# Patient Record
Sex: Female | Born: 1935 | Race: Black or African American | Hispanic: No | Smoking: Never smoker
Health system: Southern US, Community
[De-identification: ages and names within clinical notes are randomized; demographics above are authoritative.]

## PROBLEM LIST (undated history)

## (undated) DIAGNOSIS — S065X9A Traumatic subdural hemorrhage with loss of consciousness of unspecified duration, initial encounter: Secondary | ICD-10-CM

## (undated) DIAGNOSIS — E119 Type 2 diabetes mellitus without complications: Secondary | ICD-10-CM

## (undated) DIAGNOSIS — S0003XA Contusion of scalp, initial encounter: Secondary | ICD-10-CM

## (undated) DIAGNOSIS — D649 Anemia, unspecified: Secondary | ICD-10-CM

## (undated) DIAGNOSIS — J449 Chronic obstructive pulmonary disease, unspecified: Secondary | ICD-10-CM

## (undated) DIAGNOSIS — R7989 Other specified abnormal findings of blood chemistry: Secondary | ICD-10-CM

## (undated) DIAGNOSIS — E039 Hypothyroidism, unspecified: Secondary | ICD-10-CM

## (undated) DIAGNOSIS — J8410 Pulmonary fibrosis, unspecified: Secondary | ICD-10-CM

## (undated) DIAGNOSIS — F0390 Unspecified dementia without behavioral disturbance: Secondary | ICD-10-CM

## (undated) HISTORY — PX: NO PAST SURGERIES: SHX2092

---

## 2001-03-08 ENCOUNTER — Encounter: Payer: Self-pay | Admitting: Emergency Medicine

## 2001-03-08 ENCOUNTER — Emergency Department (HOSPITAL_COMMUNITY): Admission: EM | Admit: 2001-03-08 | Discharge: 2001-03-08 | Payer: Self-pay | Admitting: Emergency Medicine

## 2001-03-11 ENCOUNTER — Emergency Department (HOSPITAL_COMMUNITY): Admission: EM | Admit: 2001-03-11 | Discharge: 2001-03-11 | Payer: Self-pay | Admitting: *Deleted

## 2003-06-16 ENCOUNTER — Emergency Department (HOSPITAL_COMMUNITY): Admission: EM | Admit: 2003-06-16 | Discharge: 2003-06-16 | Payer: Self-pay | Admitting: Emergency Medicine

## 2003-08-18 ENCOUNTER — Ambulatory Visit (HOSPITAL_COMMUNITY): Admission: RE | Admit: 2003-08-18 | Discharge: 2003-08-18 | Payer: Self-pay | Admitting: Oncology

## 2003-12-15 ENCOUNTER — Emergency Department (HOSPITAL_COMMUNITY): Admission: EM | Admit: 2003-12-15 | Discharge: 2003-12-15 | Payer: Self-pay | Admitting: Emergency Medicine

## 2004-06-14 ENCOUNTER — Ambulatory Visit: Payer: Self-pay | Admitting: Oncology

## 2004-09-02 ENCOUNTER — Ambulatory Visit: Payer: Self-pay | Admitting: Oncology

## 2004-10-01 ENCOUNTER — Encounter (HOSPITAL_COMMUNITY): Admission: RE | Admit: 2004-10-01 | Discharge: 2004-12-30 | Payer: Self-pay | Admitting: Oncology

## 2004-10-23 ENCOUNTER — Ambulatory Visit: Payer: Self-pay | Admitting: Oncology

## 2004-12-11 ENCOUNTER — Ambulatory Visit: Payer: Self-pay | Admitting: Oncology

## 2005-02-14 ENCOUNTER — Ambulatory Visit: Payer: Self-pay | Admitting: Oncology

## 2005-04-02 ENCOUNTER — Ambulatory Visit: Payer: Self-pay | Admitting: Oncology

## 2005-06-18 ENCOUNTER — Ambulatory Visit: Payer: Self-pay | Admitting: Oncology

## 2005-09-22 ENCOUNTER — Ambulatory Visit: Payer: Self-pay | Admitting: Oncology

## 2006-01-06 ENCOUNTER — Ambulatory Visit: Payer: Self-pay | Admitting: Oncology

## 2006-02-10 LAB — CBC WITH DIFFERENTIAL/PLATELET
Basophils Absolute: 0.1 10*3/uL (ref 0.0–0.1)
Eosinophils Absolute: 0.3 10*3/uL (ref 0.0–0.5)
HCT: 30 % — ABNORMAL LOW (ref 34.8–46.6)
HGB: 10.7 g/dL — ABNORMAL LOW (ref 11.6–15.9)
MONO#: 0.6 10*3/uL (ref 0.1–0.9)
NEUT%: 55.5 % (ref 39.6–76.8)
WBC: 9.5 10*3/uL (ref 3.9–10.0)
lymph#: 3.3 10*3/uL (ref 0.9–3.3)

## 2006-06-05 ENCOUNTER — Ambulatory Visit: Payer: Self-pay | Admitting: Oncology

## 2006-06-30 LAB — CBC WITH DIFFERENTIAL/PLATELET
BASO%: 0.6 % (ref 0.0–2.0)
HCT: 27.4 % — ABNORMAL LOW (ref 34.8–46.6)
MCHC: 35.7 g/dL (ref 32.0–36.0)
MONO#: 0.7 10*3/uL (ref 0.1–0.9)
NEUT%: 50.2 % (ref 39.6–76.8)
WBC: 9.6 10*3/uL (ref 3.9–10.0)
lymph#: 3.7 10*3/uL — ABNORMAL HIGH (ref 0.9–3.3)

## 2006-10-15 ENCOUNTER — Ambulatory Visit: Payer: Self-pay | Admitting: Oncology

## 2008-12-16 ENCOUNTER — Emergency Department (HOSPITAL_COMMUNITY): Admission: EM | Admit: 2008-12-16 | Discharge: 2008-12-16 | Payer: Self-pay | Admitting: Emergency Medicine

## 2008-12-20 ENCOUNTER — Inpatient Hospital Stay: Admission: RE | Admit: 2008-12-20 | Discharge: 2009-01-09 | Payer: Self-pay | Admitting: Internal Medicine

## 2009-12-04 ENCOUNTER — Emergency Department (HOSPITAL_COMMUNITY): Admission: EM | Admit: 2009-12-04 | Discharge: 2009-12-04 | Payer: Self-pay | Admitting: Emergency Medicine

## 2009-12-06 ENCOUNTER — Emergency Department (HOSPITAL_COMMUNITY): Admission: EM | Admit: 2009-12-06 | Discharge: 2009-12-06 | Payer: Self-pay | Admitting: Emergency Medicine

## 2009-12-31 ENCOUNTER — Emergency Department (HOSPITAL_COMMUNITY): Admission: EM | Admit: 2009-12-31 | Discharge: 2009-12-31 | Payer: Self-pay | Admitting: Emergency Medicine

## 2010-10-22 LAB — URINALYSIS, ROUTINE W REFLEX MICROSCOPIC
Bilirubin Urine: NEGATIVE
Glucose, UA: 250 mg/dL — AB
Hgb urine dipstick: NEGATIVE
Ketones, ur: NEGATIVE mg/dL
Leukocytes, UA: NEGATIVE
Nitrite: NEGATIVE
Specific Gravity, Urine: 1.025 (ref 1.005–1.030)
Urobilinogen, UA: 0.2 mg/dL (ref 0.0–1.0)
pH: 6 (ref 5.0–8.0)

## 2010-10-22 LAB — COMPREHENSIVE METABOLIC PANEL
ALT: 14 U/L (ref 0–35)
AST: 19 U/L (ref 0–37)
Albumin: 3.6 g/dL (ref 3.5–5.2)
Alkaline Phosphatase: 78 U/L (ref 39–117)
BUN: 17 mg/dL (ref 6–23)
CO2: 28 mEq/L (ref 19–32)
Calcium: 9.4 mg/dL (ref 8.4–10.5)
Chloride: 97 mEq/L (ref 96–112)
Creatinine, Ser: 0.95 mg/dL (ref 0.4–1.2)
GFR calc Af Amer: >60 mL/min (ref >60)
GFR calc non Af Amer: 56 mL/min — ABNORMAL LOW (ref >60)
Glucose, Bld: 306 mg/dL — ABNORMAL HIGH (ref 70–99)
Potassium: 4.2 mEq/L (ref 3.5–5.1)
Sodium: 132 mEq/L — ABNORMAL LOW (ref 135–145)
Total Bilirubin: 0.5 mg/dL (ref 0.3–1.2)
Total Protein: 8.1 g/dL (ref 6.0–8.3)

## 2010-10-22 LAB — URINE MICROSCOPIC-ADD ON

## 2010-10-22 LAB — CBC
HCT: 36 % (ref 36.0–46.0)
Hemoglobin: 12.7 g/dL (ref 12.0–15.0)
MCHC: 35.1 g/dL (ref 30.0–36.0)
MCV: 90.2 fL (ref 78.0–100.0)
Platelets: 186 10*3/uL (ref 150–400)
RBC: 3.99 MIL/uL (ref 3.87–5.11)
RDW: 13.7 % (ref 11.5–15.5)
WBC: 7.7 10*3/uL (ref 4.0–10.5)

## 2010-10-22 LAB — URINE CULTURE
Colony Count: NO GROWTH
Culture: NO GROWTH

## 2010-10-22 LAB — DIFFERENTIAL
Basophils Absolute: 0 10*3/uL (ref 0.0–0.1)
Basophils Relative: 1 % (ref 0–1)
Eosinophils Absolute: 0.2 10*3/uL (ref 0.0–0.7)
Eosinophils Relative: 3 % (ref 0–5)
Lymphocytes Relative: 31 % (ref 12–46)
Lymphs Abs: 2.4 10*3/uL (ref 0.7–4.0)
Monocytes Absolute: 0.5 10*3/uL (ref 0.1–1.0)
Monocytes Relative: 7 % (ref 3–12)
Neutro Abs: 4.6 10*3/uL (ref 1.7–7.7)
Neutrophils Relative %: 59 % (ref 43–77)

## 2010-10-22 LAB — GLUCOSE, CAPILLARY: Glucose-Capillary: 306 mg/dL — ABNORMAL HIGH (ref 70–99)

## 2010-11-12 LAB — URINALYSIS, ROUTINE W REFLEX MICROSCOPIC
Bilirubin Urine: NEGATIVE
Glucose, UA: NEGATIVE mg/dL
Hgb urine dipstick: NEGATIVE
Ketones, ur: NEGATIVE mg/dL
Nitrite: NEGATIVE
Protein, ur: NEGATIVE mg/dL
Specific Gravity, Urine: 1.015 (ref 1.005–1.030)
Urobilinogen, UA: 2 mg/dL — ABNORMAL HIGH (ref 0.0–1.0)
pH: 6 (ref 5.0–8.0)

## 2010-11-12 LAB — DIFFERENTIAL
Basophils Absolute: 0.1 10*3/uL (ref 0.0–0.1)
Basophils Relative: 1 % (ref 0–1)
Eosinophils Absolute: 0.6 10*3/uL (ref 0.0–0.7)
Eosinophils Relative: 5 % (ref 0–5)
Lymphocytes Relative: 35 % (ref 12–46)
Lymphs Abs: 4.3 10*3/uL — ABNORMAL HIGH (ref 0.7–4.0)
Monocytes Absolute: 0.9 10*3/uL (ref 0.1–1.0)
Monocytes Relative: 7 % (ref 3–12)
Neutro Abs: 6.4 10*3/uL (ref 1.7–7.7)
Neutrophils Relative %: 52 % (ref 43–77)

## 2010-11-12 LAB — BASIC METABOLIC PANEL
BUN: 16 mg/dL (ref 6–23)
CO2: 32 mEq/L (ref 19–32)
Calcium: 10.1 mg/dL (ref 8.4–10.5)
Chloride: 99 mEq/L (ref 96–112)
Creatinine, Ser: 0.95 mg/dL (ref 0.4–1.2)
GFR calc Af Amer: >60 mL/min (ref >60)
GFR calc non Af Amer: 56 mL/min — ABNORMAL LOW (ref >60)
Glucose, Bld: 154 mg/dL — ABNORMAL HIGH (ref 70–99)
Potassium: 3.8 mEq/L (ref 3.5–5.1)
Sodium: 137 mEq/L (ref 135–145)

## 2010-11-12 LAB — URINE MICROSCOPIC-ADD ON

## 2010-11-12 LAB — CBC
HCT: 30.5 % — ABNORMAL LOW (ref 36.0–46.0)
Hemoglobin: 11.1 g/dL — ABNORMAL LOW (ref 12.0–15.0)
MCHC: 36.4 g/dL — ABNORMAL HIGH (ref 30.0–36.0)
MCV: 95.7 fL (ref 78.0–100.0)
Platelets: 260 10*3/uL (ref 150–400)
RBC: 3.19 MIL/uL — ABNORMAL LOW (ref 3.87–5.11)
RDW: 17 % — ABNORMAL HIGH (ref 11.5–15.5)
WBC: 12.4 10*3/uL — ABNORMAL HIGH (ref 4.0–10.5)

## 2013-01-10 ENCOUNTER — Non-Acute Institutional Stay (SKILLED_NURSING_FACILITY): Payer: Medicare Other | Admitting: Internal Medicine

## 2013-01-10 DIAGNOSIS — R269 Unspecified abnormalities of gait and mobility: Secondary | ICD-10-CM

## 2013-01-10 DIAGNOSIS — J449 Chronic obstructive pulmonary disease, unspecified: Secondary | ICD-10-CM

## 2013-01-10 DIAGNOSIS — F0281 Dementia in other diseases classified elsewhere with behavioral disturbance: Secondary | ICD-10-CM

## 2013-01-19 NOTE — Progress Notes (Addendum)
Patient ID: Dominique Miller, female   DOB: November 15, 1918, 79 y.o.   MRN: [id number]           HISTORY & PHYSICAL  DATE:  01/10/2013  FACILITY: [facility]   LEVEL OF CARE:   SNF   CHIEF COMPLAINT:  Status post transfer from [facility] in [place].    HISTORY OF PRESENT ILLNESS:  Dominique Miller is a 79 year-old woman who  apparently was in the building in 2009, although I do not have any recollection of her.  In any case, she was transferred from [facility] in [place].  The exact circumstances are not really clear.     PAST MEDICAL HISTORY/PROBLEM LIST:  Alzheimer's disease.    Muscle weakness.    Depression with anxiety.    COPD.    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    Hypothyroidism.    CURRENT MEDICATIONS:  Medication list is reviewed.    Omeprazole 20 q.d.   Metoprolol 12.5 q.d.   Claritin 10 q.d.   Xanax 0.5  t.i.d.   Lisinopril 2.5 q.d.   Duonebs q.6 p.r.n.   Cymbalta 30 q.d.   Folic acid 800 mcg a day.   Xanax 0.25, 1 p.o. q.6 h p.r.n.    Synthroid 137, 1 p.o. q.h.s.   Promethazine p.r.n.   Hydrocodone/APAP p.r.n.   Divalproex sodium 250 sprinkles p.o. b.i.d.   Calcium 500 plus D, 1 p.o. b.i.d.   SOCIAL HISTORY:  I have very little information on this patient.   CODE STATUS:  She is listed as a Full Code.    FAMILY HISTORY:  Not available from any current source.   REVIEW OF SYSTEMS:   CHEST/RESPIRATORY:  No complaints of shortness of breath.   CARDIAC:   No chest pain.   GI:  No nausea,  vomiting or abdominal pain.  MUSCULOSKELETAL:  Extremities:  Complains of "arthritis".    PHYSICAL EXAMINATION:   GENERAL APPEARANCE:  79 year-old woman who is pleasant, animated.    HEENT:  MOUTH/THROAT:   She is edentulous.   NECK/THYROID:  No lymphadenopathy.  No masses.   CHEST/RESPIRATORY:  Shallow air entry, but no crackles or wheezes.    CARDIOVASCULAR:  CARDIAC:   Heart sounds are normal.  There are no murmurs.  No signs  of heart failure.   GASTROINTESTINAL:  ABDOMEN:   Soft.   LIVER/SPLEEN/KIDNEYS:  No liver, no spleen.  No tenderness.   GENITOURINARY:  BLADDER:   Not enlarged.  There is no costovertebral angle tenderness.   CIRCULATION:   EDEMA/VARICOSITIES:  Mild edema, likely secondary to venous stasis.   NEUROLOGICAL:    SENSATION/STRENGTH:  She appears to have antigravity strength.   BALANCE/GAIT:  She cannot stand up out of a chair.   PSYCHIATRIC:   MENTAL STATUS:   She is aware it is 2000-something.  Does not remember being in [facility].  Says she was living at home with her husband.    ASSESSMENT/PLAN:  Alzheimer's disease with behavioral disturbances.  The lady clearly makes some psychotic references, i.e. "listens to the children", "I can hear them talking".  Nevertheless, she does not appear to be at all bothered by any of this.  I do not think any medication adjustments are necessary.    History of depression with anxiety.  She is on Cymbalta.  She is certainly not currently depressed.  I am really not a big believer in Xanax in this population.  However, I will not  alter this for now.    COPD.  Based on the bedside exam, I would agree with this.  She is on Duonebs p.r.n.  At this point, I think this is reasonable.     Probable hypertension.  On lisinopril.    Hypothyroidism.  On replacement.    History of vitamin B12 deficiency.  She is on oral replacement.    History of GERD.  On omeprazole 20 q.d.    The patient's gait ataxia is probably severe.  She cannot bring herself to a standing position.  As far as she got with my assistance, she is flexed at the knees and the hips.  I do not think any of her weaknesses are lateralizing.  I think this is disuse, chronic immobility related to dementia.    At this point, her behavioral disturbances are mild.  As mentioned, she is clearly psychotic.  However, seems to tolerate this well and is pleasant.    CPT CODE: 16109

## 2013-02-23 ENCOUNTER — Non-Acute Institutional Stay (SKILLED_NURSING_FACILITY): Payer: Medicare Other | Admitting: Internal Medicine

## 2013-02-23 DIAGNOSIS — J449 Chronic obstructive pulmonary disease, unspecified: Secondary | ICD-10-CM

## 2013-02-23 DIAGNOSIS — F0281 Dementia in other diseases classified elsewhere with behavioral disturbance: Secondary | ICD-10-CM

## 2013-02-23 NOTE — Progress Notes (Signed)
Patient ID: Dominique Miller, female   DOB: [birth year], 79 y.o.   MRN: [id number]  facility [facility]. Chief complaint; review of medical issues/monthly [facility] note for June.  History this is a patient admitted to the facility on June 9. She come from him on to a nursing home. She does have dementia, however, was largely immobile. A TSH level was found to be 28 and her medications have been adjusted. A chest x-ray was done as a screening for TB suggested CHF. He does not seem to have any physical findings to go along with this.  Physical examination blood pressure is 120/62 pulse 80, respirations 18. Respiratory air entry is completely clear. Cardiac heart sounds are normal. No signs of congestive heart failure.  Impression/plan 1. Dementia. This does not appear to be unstable. #2 type 2 diabetes apparently had a hemoglobin A1c of 7.2 in more. #3 COPD this does not appear to be unstable.  Patient with dementia with behavioral disturbances, however, this does not appear to be unstable. She has COPD based on that, my bedside exam, although I see no evidence of heart failure. Her hypothyroidism. Medication has been adjusted appear her hemoglobin A1c probably should be repeated before being diagnosed with diabetes

## 2013-04-03 ENCOUNTER — Non-Acute Institutional Stay (SKILLED_NURSING_FACILITY): Payer: Medicare Other | Admitting: Internal Medicine

## 2013-04-03 DIAGNOSIS — F0281 Dementia in other diseases classified elsewhere with behavioral disturbance: Secondary | ICD-10-CM

## 2013-04-03 DIAGNOSIS — E1129 Type 2 diabetes mellitus with other diabetic kidney complication: Secondary | ICD-10-CM

## 2013-04-03 DIAGNOSIS — J449 Chronic obstructive pulmonary disease, unspecified: Secondary | ICD-10-CM

## 2013-04-03 NOTE — Progress Notes (Signed)
Patient ID: Dominique Miller, female   DOB: April 14, 1919, 79 y.o.   MRN: [id number]   facility [facility]. Chief complaint; review of medical issues/monthly [facility] note for July  History this is a patient admitted to the facility on June 9. She come from another  nursing home. She does have dementia, however, was largely immobile. A TSH level was found to be 28 and her medications have been adjusted. A chest x-ray was done as a screening for TB suggested CHF. He does not seem to have any physical findings to go along with this.   Medical history/problem l #1 Alzheimer's disease #2 muscle weakness #3 depression with anxiety #4 COPD #5 gait ataxia with history of falls #6 hypothyroidism #7 vitamin B12 deficiency #8 gastroesophageal reflux disease  Physical exam #1 pulse 72 respirations 20 blood pressure 120/62 Respiratory clear entry bilaterally Cardiac heart sounds were normal Abdomen obese no liver no spleen no tenderness. Mental status orientated to self doubt depression    Impression/plan  1. Dementia. This does not appear to be unstable. #2 type 2 diabetes apparently had a hemoglobin A1c of 7.2 in more . #3 COPD this does not appear to be unstable.  #4 hypothyroidism most recently her TSH is 3.64  Patient with dementia with behavioral disturbances, however, this does not appear to be unstable. She has COPD based on that, my bedside exam, although I see no evidence of heart failure. Her hypothyroidism. Medication has been adjusted appear her hemoglobin A1c probably should be repeated before being diagnosed with diabetes

## 2013-04-18 ENCOUNTER — Non-Acute Institutional Stay (SKILLED_NURSING_FACILITY): Payer: Medicare Other | Admitting: Internal Medicine

## 2013-04-18 DIAGNOSIS — R269 Unspecified abnormalities of gait and mobility: Secondary | ICD-10-CM

## 2013-04-18 DIAGNOSIS — F02818 Dementia in other diseases classified elsewhere, unspecified severity, with other behavioral disturbance: Secondary | ICD-10-CM

## 2013-04-18 DIAGNOSIS — F0281 Dementia in other diseases classified elsewhere with behavioral disturbance: Secondary | ICD-10-CM

## 2013-04-18 NOTE — Progress Notes (Signed)
Patient ID: Dominique Miller, female   DOB: 1919-02-14, 79 y.o.   MRN: [id number]   facility [facility]. Chief complaint; review of medical issues/monthly [facility] note for August  History this is a patient admitted to the facility on June 9. She come from another  nursing home. She does have dementia, however, was largely immobile. A TSH level was found to be 28 and her medications have been adjusted. A chest x-ray was done as a screening for TB suggested CHF. She does not seem to have any physical findings to go along with this.   Medical history/problem l #1 Alzheimer's disease #2 muscle weakness #3 depression with anxiety #4 COPD #5 gait ataxia with history of falls #6 hypothyroidism #7 vitamin B12 deficiency #8 gastroesophageal reflux disease  Physical exam #1 pulse 74 respirations 16 blood pressure 100/60 wieght 187 Respiratory clear entry bilaterally Cardiac heart sounds were normal Abdomen obese no liver no spleen no tenderness. Mental status orientated to self doubt depression    Impression/plan  1. Dementia. This does not appear to be unstable. #2 type 2 diabetes apparently had a hemoglobin A1c of 7.2 in more . #3 COPD this does not appear to be unstable.  #4 hypothyroidism most recently her TSH is 3.64 August 19  Patient with dementia with behavioral disturbances, however, this does not appear to be unstable. She has COPD based on that, my bedside exam, although I see no evidence of heart failure. Her hypothyroidism. Medication has been adjusted appear her hemoglobin A1c probably should be repeated before being diagnosed with diabetes.

## 2013-04-25 ENCOUNTER — Other Ambulatory Visit: Payer: Self-pay | Admitting: *Deleted

## 2013-04-25 MED ORDER — ALPRAZOLAM 0.5 MG PO TBDP: ORAL_TABLET | ORAL | Status: DC

## 2013-05-29 ENCOUNTER — Non-Acute Institutional Stay (SKILLED_NURSING_FACILITY): Payer: Medicare Other | Admitting: Internal Medicine

## 2013-05-29 DIAGNOSIS — F0281 Dementia in other diseases classified elsewhere with behavioral disturbance: Secondary | ICD-10-CM

## 2013-05-29 DIAGNOSIS — R269 Unspecified abnormalities of gait and mobility: Secondary | ICD-10-CM

## 2013-05-29 NOTE — Progress Notes (Signed)
Patient ID: Dominique Miller, female   DOB: 04-17-1919, 79 y.o.   MRN: [id number]   facility [facility]. Chief complaint; review of medical issues/monthly [facility] note for September.   History this is a patient admitted to the facility on June 9. She come from another  nursing home. She does have dementia, however, was largely immobile. A TSH level was found to be 28 and her medications have been adjusted. A chest x-ray was done as a screening for TB suggested CHF. She does not seem to have any physical findings to go along with this.  Patient appears to be stable there's been no issues note her most recent TSH was 3.64 on August 19 other lab work screened in June showed a normal CBC differential normal comprehensive metabolic panel   Medical history/problem list; #1 Alzheimer's disease #2 muscle weakness #3 depression with anxiety #4 COPD #5 gait ataxia with history of falls #6 hypothyroidism #7 vitamin B12 deficiency #8 gastroesophageal reflux disease  Physical exam #1 pulse 74 respirations 16 blood pressure 100/60 wieght 187 Respiratory clear entry bilaterally Cardiac heart sounds were normal Abdomen obese no liver no spleen no tenderness. Mental status orientated to self doubt depression    Impression/plan  1. Dementia. This does not appear to be unstable. #2 type 2 diabetes apparently had a hemoglobin A1c of 7.2 in may . #3 COPD this does not appear to be unstable.  #4 hypothyroidism most recently her TSH is 3.64 August 19  Patient with dementia with behavioral disturbances, however, this does not appear to be unstable. She has COPD based on that, my bedside exam, although I see no evidence of heart failure. Her hypothyroidism. Medication has been adjusted appear her hemoglobin A1c probably should be repeated before being diagnosed with diabetes.

## 2013-06-27 ENCOUNTER — Non-Acute Institutional Stay (SKILLED_NURSING_FACILITY): Payer: Medicare Other | Admitting: Internal Medicine

## 2013-06-27 DIAGNOSIS — F0281 Dementia in other diseases classified elsewhere with behavioral disturbance: Secondary | ICD-10-CM

## 2013-06-27 DIAGNOSIS — J4489 Other specified chronic obstructive pulmonary disease: Secondary | ICD-10-CM

## 2013-06-27 DIAGNOSIS — J449 Chronic obstructive pulmonary disease, unspecified: Secondary | ICD-10-CM

## 2013-06-27 DIAGNOSIS — F02818 Dementia in other diseases classified elsewhere, unspecified severity, with other behavioral disturbance: Secondary | ICD-10-CM

## 2013-06-27 NOTE — Progress Notes (Signed)
Patient ID: Dominique Miller, female   DOB: [birth year], 79 y.o.   MRN: [id number]   facility [facility]. Chief complaint; review of medical issues/monthly [facility] note for October  History this is a patient admitted to the facility on June 9. She come from another  nursing home. She does have dementia, however, was largely immobile. A TSH level was found to be 28 and her medications have been adjusted. A chest x-ray was done as a screening for TB suggested CHF. She does not seem to have any physical findings to go along with this.  Patient appears to be stable there's been no issues note her most recent TSH was 3.64. There have been no additional issues   Medical history/problem list; #1 Alzheimer's disease #2 muscle weakness #3 depression with anxiety #4 COPD #5 gait ataxia with history of falls #6 hypothyroidism #7 vitamin B12 deficiency #8 gastroesophageal reflux disease  Physical exam Vitals: t-98.3 p-76 RR 20 128/64 wt 180lbs Respiratory clear entry bilaterally Cardiac heart sounds were normal Abdomen obese no liver no spleen no tenderness. Mental status orientated to self doubt depression    Impression/plan  1. Dementia. This does not appear to be unstable. #2 type 2 diabetes apparently had a hemoglobin A1c of 7.2 in may . #3 COPD this does not appear to be unstable.  #4 hypothyroidism most recently her TSH is 3.64 August 19  Patient with dementia with behavioral disturbances, however, this does not appear to be unstable. She has COPD based on that, my bedside exam, although I see no evidence of heart failure. Her Synthroid Medication has been adjusted appear her hemoglobin A1c probably should be repeated before being diagnosed with diabetes.

## 2013-07-24 ENCOUNTER — Non-Acute Institutional Stay (SKILLED_NURSING_FACILITY): Payer: Medicare Other | Admitting: Internal Medicine

## 2013-07-24 DIAGNOSIS — F0281 Dementia in other diseases classified elsewhere with behavioral disturbance: Secondary | ICD-10-CM

## 2013-07-24 DIAGNOSIS — R634 Abnormal weight loss: Secondary | ICD-10-CM

## 2013-07-24 DIAGNOSIS — E1129 Type 2 diabetes mellitus with other diabetic kidney complication: Secondary | ICD-10-CM

## 2013-07-24 NOTE — Progress Notes (Signed)
Patient ID: Dominique Miller, female   DOB: 1918/12/15, 79 y.o.   MRN: [id number]   facility [facility]. Chief complaint; review of medical issues/monthly [employer] visit for November  History this is a patient admitted to the facility on June 9. She come from another  nursing home. She does have dementia, however, was largely immobile. A TSH level was found to be 28 and her medications have been adjusted. A chest x-ray was done as a screening for TB suggested CHF. She does not seem to have any physical findings to go along with this.  Patient appears to be stable there's been no issues note her most recent TSH was 3.64. There have been no additional issues   Medical history/problem list; #1 Alzheimer's disease #2 muscle weakness #3 depression with anxiety #4 COPD #5 gait ataxia with history of falls #6 hypothyroidism #7 vitamin B12 deficiency #8 gastroesophageal reflux disease  Social; the patient remains a full code  Physical exam Vitals; temperature is 97.3 pulse 76 respirations 20 blood pressure 120/66 weight at 174 is a significant decline of 191 pounds since June Respiratory clear entry bilaterally Cardiac heart sounds were normal Abdomen obese no liver no spleen no tenderness. Mental status orientated to self doubt depression    Impression/plan  1. Dementia. This does not appear to be unstable. #2 type 2 diabetes apparently had a hemoglobin A1c of 7.2 in may . #3 COPD this does not appear to be unstable.  #4 hypothyroidism most recently her TSH is 3.64 August 19 #5 weight low last TSH in August was normal at 3.64 last CBC and comprehensive metabolic panel were normal in  Patient with dementia with behavioral disturbances, however, this does not appear to be unstable. She has COPD based on that, my bedside exam, although I see no evidence of heart failure. Her Synthroid Medication has been adjusted appear her hemoglobin A1c probably should be repeated before being  diagnosed with diabetes. Weight loss will need to be carefully followed

## 2013-08-29 ENCOUNTER — Non-Acute Institutional Stay (SKILLED_NURSING_FACILITY): Payer: Medicare Other | Admitting: Internal Medicine

## 2013-08-29 DIAGNOSIS — F0281 Dementia in other diseases classified elsewhere with behavioral disturbance: Secondary | ICD-10-CM

## 2013-08-29 DIAGNOSIS — J449 Chronic obstructive pulmonary disease, unspecified: Secondary | ICD-10-CM

## 2013-08-29 DIAGNOSIS — F02818 Dementia in other diseases classified elsewhere, unspecified severity, with other behavioral disturbance: Secondary | ICD-10-CM

## 2013-08-29 NOTE — Progress Notes (Signed)
Patient ID: Dominique RilingCarrie M Stanger, female   DOB: 03/29/1919, 80 y.o.   MRN: [id number]   [name] Chief complaint; review of medical issues/monthly [facility] visit for December  History this is a patient admitted to the facility on June 9. She come from another  nursing home. She does have dementia, however, was largely immobile. A TSH level was found to be 28 and her medications have been adjusted. A chest x-ray was done as a screening for TB suggested CHF. She does not seem to have any physical findings to go along with this.  Patient appears to be stable there's been no issues note her most recent TSH was 3.64. There have been no additional issues   Medical history/problem list; #1 Alzheimer's disease #2 muscle weakness #3 depression with anxiety #4 COPD #5 gait ataxia with history of falls #6 hypothyroidism #7 vitamin B12 deficiency #8 gastroesophageal reflux disease  Social; the patient remains a full code  Physical exam Vitals; temperature is 97.3 pulse 76 respirations 20 blood pressure 120/66 weight at 174 is a significant decline of 191 pounds since June Respiratory clear entry bilaterally Cardiac heart sounds were normal Abdomen obese no liver no spleen no tenderness. Mental status orientated to self doubt depression    Impression/plan  1. Dementia. This does not appear to be unstable. #2 type 2 diabetes apparently had a hemoglobin A1c of 7.2 in may, repeated at 6.6 in December.  . #3 COPD this does not appear to be unstable.  #4 hypothyroidism most recently her TSH is 3.64 August 19 #5 weight low last TSH in August was normal at 3.64 last CBC and comprehensive metabolic panel were normal in  Patient with dementia with behavioral disturbances, however, this does not appear to be unstable. She has COPD based on that, my bedside exam, although I see no evidence of heart failure. Her Synthroid Medication has been adjusted. Appears to diabetic by definition but on no  rx.

## 2013-10-07 ENCOUNTER — Other Ambulatory Visit: Payer: Self-pay | Admitting: *Deleted

## 2013-10-07 MED ORDER — ALPRAZOLAM 0.5 MG PO TABS: ORAL_TABLET | ORAL | Status: DC

## 2013-10-07 NOTE — Telephone Encounter (Signed)
[facility] 

## 2013-10-26 ENCOUNTER — Non-Acute Institutional Stay (SKILLED_NURSING_FACILITY): Payer: Medicare Other | Admitting: Internal Medicine

## 2013-10-26 DIAGNOSIS — E039 Hypothyroidism, unspecified: Secondary | ICD-10-CM

## 2013-10-26 DIAGNOSIS — F02818 Dementia in other diseases classified elsewhere, unspecified severity, with other behavioral disturbance: Secondary | ICD-10-CM

## 2013-10-26 DIAGNOSIS — F0281 Dementia in other diseases classified elsewhere with behavioral disturbance: Secondary | ICD-10-CM

## 2013-10-26 DIAGNOSIS — J449 Chronic obstructive pulmonary disease, unspecified: Secondary | ICD-10-CM

## 2013-10-26 NOTE — Progress Notes (Signed)
Patient ID: Jasper RilingCarrie M Entwistle, female   DOB: 03/09/1919, 80 y.o.   MRN: [id number]   [name]. Chief complaint; review of medical issues/monthly [employer] visit for February  History this is a patient admitted to the facility on June 9. She come from another  nursing home. She does have dementia, however, was largely immobile. A TSH level was found to be 28 and her medications have been adjusted. A chest x-ray was done as a screening for TB suggested CHF. She does not seem to have any physical findings to go along with this.  Patient appears to be stable there's been no issues note her most recent TSH was 3.64. There have been no additional issues   Medical history/problem list; #1 Alzheimer's disease #2 muscle weakness #3 depression with anxiety #4 COPD #5 gait ataxia with history of falls #6 hypothyroidism #7 vitamin B12 deficiency #8 gastroesophageal reflux disease  Social; the patient remains a full code  Physical exam Vitals; temperature is 97.3 pulse 76 respirations 20 blood pressure 120/66 weight at 174 is a significant decline of 191 pounds since June Respiratory clear entry bilaterally Cardiac heart sounds were normal Abdomen obese no liver no spleen no tenderness. Mental status orientated to self doubt depression    Impression/plan  1. Dementia. This does not appear to be unstable. #2 type 2 diabetes apparently had a hemoglobin A1c of 7.2 in may, repeated at 6.6 in December.  . #3 COPD this does not appear to be unstable.  #4 hypothyroidism most recently her TSH is 3.64 August 19. Current dose is 137ug  Patient with dementia with behavioral disturbances, however, this does not appear to be unstable. She has COPD based on that, my bedside exam, although I see no evidence of heart failure. Her Synthroid Medication has been adjusted. Appears to diabetic by definition but on no rx.

## 2013-11-24 ENCOUNTER — Other Ambulatory Visit: Payer: Self-pay | Admitting: *Deleted

## 2013-11-24 MED ORDER — ALPRAZOLAM 0.5 MG PO TABS: ORAL_TABLET | ORAL | Status: DC

## 2013-11-24 NOTE — Telephone Encounter (Signed)
[facility] 

## 2013-11-24 NOTE — Telephone Encounter (Signed)
[facility] 

## 2013-11-26 ENCOUNTER — Non-Acute Institutional Stay (SKILLED_NURSING_FACILITY): Payer: Medicare Other | Admitting: Internal Medicine

## 2013-11-26 DIAGNOSIS — J4489 Other specified chronic obstructive pulmonary disease: Secondary | ICD-10-CM

## 2013-11-26 DIAGNOSIS — J449 Chronic obstructive pulmonary disease, unspecified: Secondary | ICD-10-CM

## 2013-11-26 DIAGNOSIS — F0281 Dementia in other diseases classified elsewhere with behavioral disturbance: Secondary | ICD-10-CM

## 2013-11-26 DIAGNOSIS — F02818 Dementia in other diseases classified elsewhere, unspecified severity, with other behavioral disturbance: Secondary | ICD-10-CM

## 2013-11-26 NOTE — Progress Notes (Signed)
Patient ID: Dominique Miller, female   DOB: 01/17/1919, 80 y.o.   MRN: [id number]   [name]. Chief complaint; review of medical issues/monthly [facility] visit for March  History this is a patient admitted to the facility on June 9. She come from another  nursing home. She does have dementia, however, was largely immobile. A TSH level was found to be 28 and her medications have been adjusted. A chest x-ray was done as a screening for TB suggested CHF. She does not seem to have any physical findings to go along with this.  Patient appears to be stable there's been no issues note her most recent TSH was 3.64. There have been no additional issues   Medical history/problem list; #1 Alzheimer's disease #2 muscle weakness #3 depression with anxiety #4 COPD #5 gait ataxia with history of falls #6 hypothyroidism #7 vitamin B12 deficiency #8 gastroesophageal reflux disease  Medications 12.5 mg twice a day Claritin 10 mg daily and Zestril 2.5 daily Cymbalta 30 mg daily Folic acid 800 mcg daily Vitamin B12 thousand daily Synthroid 137 mcg daily Depakote sprinkles 250 twice a day Os-Cal 500+ D2 100 one tablet twice daily Xanax 0.5 twice a day Prilosec 20 daily Vitamin D3 50,000 units every month  Social; the patient remains a full code  Physical exam Vitals; temperature 98.7-pulse 80-respirations 28-blood pressure 128/58-weight 174 pounds-O2 sat 93% on room air Respiratory clear entry bilaterally Cardiac heart sounds were normal Abdomen obese no liver no spleen no tenderness. Mental status orientated to self doubt depression    Impression/plan  1. Dementia. This does not appear to be unstable. #2 type 2 diabetes apparently had a hemoglobin A1c of 7.2 in may, repeated at 6.6 in December.  . #3 COPD this does not appear to be unstable.  #4 hypothyroidism most recently her TSH is 3.64 August 19. Current dose is 137ug  Patient with dementia with behavioral disturbances,  however, this does not appear to be unstable. She has COPD based on that, my bedside exam, although I see no evidence of heart failure. Her Synthroid Medication has been adjusted. Appears to diabetic by definition but on no rx.

## 2014-01-04 ENCOUNTER — Non-Acute Institutional Stay (SKILLED_NURSING_FACILITY): Payer: Medicare Other | Admitting: Internal Medicine

## 2014-01-04 DIAGNOSIS — E039 Hypothyroidism, unspecified: Secondary | ICD-10-CM

## 2014-01-04 DIAGNOSIS — J449 Chronic obstructive pulmonary disease, unspecified: Secondary | ICD-10-CM

## 2014-01-14 NOTE — Progress Notes (Signed)
Patient ID: Dominique Miller, female   DOB: 07/04/1919, 80 y.o.   MRN: [id number]    [facility]. Chief complaint; review of medical issues/monthly [facility] visit for April  History this is a patient admitted to the facility on June 9. She come from another  nursing home. She does have dementia, however, was largely immobile. A TSH level was found to be 28 and her medications have been adjusted. A chest x-ray was done as a screening for TB suggested CHF. She does not seem to have any physical findings to go along with this.  Patient appears to be stable, Synthroid increased to 125ug po qd. There have been no additional issues   Medical history/problem list; #1 Alzheimer's disease #2 muscle weakness #3 depression with anxiety #4 COPD #5 gait ataxia with history of falls #6 hypothyroidism #7 vitamin B12 deficiency #8 gastroesophageal reflux disease  Medications 12.5 mg twice a day Claritin 10 mg daily and Zestril 2.5 daily Cymbalta 30 mg daily Folic acid 800 mcg daily Vitamin B12 thousand daily Synthroid 137 mcg daily Depakote sprinkles 250 twice a day Os-Cal 500+ D2 100 one tablet twice daily Xanax 0.5 twice a day Prilosec 20 daily Vitamin D3 50,000 units every month  Social; the patient remains a full code  Physical exam Vitals; temperature 98.7-pulse 80-respirations 28-blood pressure 128/58-weight 174 pounds-O2 sat 93% on room air Respiratory clear entry bilaterally Cardiac heart sounds were normal Abdomen obese no liver no spleen no tenderness. Mental status orientated to self doubt depression    Impression/plan  1. Dementia. This does not appear to be unstable. #2 type 2 diabetes apparently had a hemoglobin A1c of 7.2 in may, repeated at 6.6 in December.  . #3 COPD this does not appear to be unstable.    Patient with dementia with behavioral disturbances, however, this does not appear to be unstable. She has COPD based on that, my bedside exam, although  I see no evidence of heart failure. Her Synthroid Medication has been adjusted. Appears to diabetic by definition but on no rx.

## 2014-03-03 ENCOUNTER — Ambulatory Visit (HOSPITAL_COMMUNITY)
Admission: RE | Admit: 2014-03-03 | Discharge: 2014-03-03 | Disposition: A | Discharge status: Home / Self Care | Payer: PRIVATE HEALTH INSURANCE | Source: Ambulatory Visit | Attending: Internal Medicine | Admitting: Internal Medicine

## 2014-03-03 ENCOUNTER — Emergency Department (HOSPITAL_COMMUNITY)
Admission: EM | Admit: 2014-03-03 | Discharge: 2014-03-03 | Disposition: A | Discharge status: Home / Self Care | Payer: PRIVATE HEALTH INSURANCE | Attending: Emergency Medicine | Admitting: Emergency Medicine

## 2014-03-03 ENCOUNTER — Other Ambulatory Visit (HOSPITAL_BASED_OUTPATIENT_CLINIC_OR_DEPARTMENT_OTHER): Payer: Self-pay | Admitting: Internal Medicine

## 2014-03-03 ENCOUNTER — Emergency Department (HOSPITAL_COMMUNITY): Payer: PRIVATE HEALTH INSURANCE

## 2014-03-03 ENCOUNTER — Encounter (HOSPITAL_COMMUNITY): Payer: Self-pay | Admitting: Emergency Medicine

## 2014-03-03 DIAGNOSIS — W1809XA Striking against other object with subsequent fall, initial encounter: Secondary | ICD-10-CM | POA: Insufficient documentation

## 2014-03-03 DIAGNOSIS — D649 Anemia, unspecified: Secondary | ICD-10-CM | POA: Diagnosis not present

## 2014-03-03 DIAGNOSIS — S06300A Unspecified focal traumatic brain injury without loss of consciousness, initial encounter: Secondary | ICD-10-CM | POA: Insufficient documentation

## 2014-03-03 DIAGNOSIS — F0390 Unspecified dementia without behavioral disturbance: Secondary | ICD-10-CM | POA: Diagnosis not present

## 2014-03-03 DIAGNOSIS — W19XXXA Unspecified fall, initial encounter: Secondary | ICD-10-CM | POA: Insufficient documentation

## 2014-03-03 DIAGNOSIS — Y9389 Activity, other specified: Secondary | ICD-10-CM | POA: Insufficient documentation

## 2014-03-03 DIAGNOSIS — Z79899 Other long term (current) drug therapy: Secondary | ICD-10-CM | POA: Insufficient documentation

## 2014-03-03 DIAGNOSIS — S0510XA Contusion of eyeball and orbital tissues, unspecified eye, initial encounter: Secondary | ICD-10-CM | POA: Diagnosis not present

## 2014-03-03 DIAGNOSIS — S065X0A Traumatic subdural hemorrhage without loss of consciousness, initial encounter: Secondary | ICD-10-CM | POA: Insufficient documentation

## 2014-03-03 DIAGNOSIS — E039 Hypothyroidism, unspecified: Secondary | ICD-10-CM | POA: Diagnosis not present

## 2014-03-03 DIAGNOSIS — S0511XA Contusion of eyeball and orbital tissues, right eye, initial encounter: Secondary | ICD-10-CM

## 2014-03-03 DIAGNOSIS — S065X9A Traumatic subdural hemorrhage with loss of consciousness of unspecified duration, initial encounter: Secondary | ICD-10-CM | POA: Diagnosis not present

## 2014-03-03 DIAGNOSIS — S0990XA Unspecified injury of head, initial encounter: Secondary | ICD-10-CM | POA: Diagnosis present

## 2014-03-03 DIAGNOSIS — Y929 Unspecified place or not applicable: Secondary | ICD-10-CM | POA: Diagnosis not present

## 2014-03-03 DIAGNOSIS — S065XAA Traumatic subdural hemorrhage with loss of consciousness status unknown, initial encounter: Secondary | ICD-10-CM | POA: Diagnosis not present

## 2014-03-03 HISTORY — DX: Anemia, unspecified: D64.9

## 2014-03-03 HISTORY — DX: Unspecified dementia, unspecified severity, without behavioral disturbance, psychotic disturbance, mood disturbance, and anxiety: F03.90

## 2014-03-03 HISTORY — DX: Hypothyroidism, unspecified: E03.9

## 2014-03-03 LAB — CBC WITH DIFFERENTIAL/PLATELET
BASOS ABS: 0 10*3/uL (ref 0.0–0.1)
Basophils Relative: 1 % (ref 0–1)
Eosinophils Absolute: 0.2 10*3/uL (ref 0.0–0.7)
Eosinophils Relative: 4 % (ref 0–5)
HCT: 31.6 % — ABNORMAL LOW (ref 36.0–46.0)
Hemoglobin: 10.5 g/dL — ABNORMAL LOW (ref 12.0–15.0)
LYMPHS PCT: 32 % (ref 12–46)
Lymphs Abs: 2.1 10*3/uL (ref 0.7–4.0)
MCH: 30.9 pg (ref 26.0–34.0)
MCHC: 33.2 g/dL (ref 30.0–36.0)
MCV: 92.9 fL (ref 78.0–100.0)
Monocytes Absolute: 0.5 10*3/uL (ref 0.1–1.0)
Monocytes Relative: 7 % (ref 3–12)
NEUTROS ABS: 3.6 10*3/uL (ref 1.7–7.7)
Neutrophils Relative %: 56 % (ref 43–77)
PLATELETS: 164 10*3/uL (ref 150–400)
RBC: 3.4 MIL/uL — ABNORMAL LOW (ref 3.87–5.11)
RDW: 14.8 % (ref 11.5–15.5)
WBC: 6.5 10*3/uL (ref 4.0–10.5)

## 2014-03-03 LAB — BASIC METABOLIC PANEL
ANION GAP: 9 (ref 5–15)
BUN: 26 mg/dL — ABNORMAL HIGH (ref 6–23)
CALCIUM: 8.7 mg/dL (ref 8.4–10.5)
CHLORIDE: 96 meq/L (ref 96–112)
CO2: 29 meq/L (ref 19–32)
Creatinine, Ser: 0.93 mg/dL (ref 0.50–1.10)
GFR calc Af Amer: 60 mL/min — ABNORMAL LOW (ref >90)
GFR calc non Af Amer: 52 mL/min — ABNORMAL LOW (ref >90)
Glucose, Bld: 110 mg/dL — ABNORMAL HIGH (ref 70–99)
Potassium: 4.3 mEq/L (ref 3.7–5.3)
Sodium: 134 mEq/L — ABNORMAL LOW (ref 137–147)

## 2014-03-03 LAB — PROTIME-INR
INR: 1.15 (ref 0.00–1.49)
PROTHROMBIN TIME: 14.7 s (ref 11.6–15.2)

## 2014-03-03 NOTE — ED Provider Notes (Signed)
CSN: [id number]     Arrival date & time 03/03/14  1924 History   First MD Initiated Contact with Patient 03/03/14 1926    This chart was scribed for No att. providers found by Marica OtterNusrat Rahman, ED Scribe. This patient was seen in room APOTF/OTF and the patient's care was started at 7:29 PM. LEVEL 5 CAVEAT: DEMENTIA  Chief Complaint  Patient presents with  . Head Injury   The history is provided by the patient. No language interpreter was used.   HPI Comments: Dominique Miller is a 80 y.o. female who presents to the Emergency Department complaining of a fall from 2 days ago. Pt had outpatient imaging done today. Pt denies chest pain or abd pain.   Past Medical History  Diagnosis Date  . Dementia   . Hypothyroidism   . Anemia    History reviewed. No pertinent past surgical history. No family history on file. History  Substance Use Topics  . Smoking status: Never Smoker   . Smokeless tobacco: Not on file  . Alcohol Use: No   OB History   Grav Para Term Preterm Abortions TAB SAB Ect Mult Living                 Review of Systems  Unable to perform ROS: Dementia      Allergies  Review of patient's allergies indicates no known allergies.  Home Medications   Prior to Admission medications   Medication Sig Start Date End Date Taking? Authorizing Provider  acetaminophen (TYLENOL) 650 MG CR tablet Take 650 mg by mouth once as needed for pain.   Yes Historical Provider, MD  ALPRAZolam Prudy Feeler(XANAX) 0.5 MG tablet Take one tablet by mouth twice daily for anxiety 11/24/13  Yes Claudie ReveringJessica M Karam, NP  calcium-vitamin D (OSCAL WITH D) 500-200 MG-UNIT per tablet Take 1 tablet by mouth 2 (two) times daily.   Yes Historical Provider, MD  divalproex (DEPAKOTE SPRINKLE) 125 MG capsule Take 250 mg by mouth 2 (two) times daily.   Yes Historical Provider, MD  DULoxetine (CYMBALTA) 30 MG capsule Take 30 mg by mouth every other day.   Yes Historical Provider, MD  folic acid (FOLVITE) 800 MCG tablet Take 800  mcg by mouth daily.   Yes Historical Provider, MD  levothyroxine (SYNTHROID, LEVOTHROID) 125 MCG tablet Take 125 mcg by mouth daily.   Yes Historical Provider, MD  lisinopril (PRINIVIL,ZESTRIL) 2.5 MG tablet Take 2.5 mg by mouth daily.   Yes Historical Provider, MD  loratadine (CLARITIN) 10 MG tablet Take 10 mg by mouth daily.   Yes Historical Provider, MD  metoprolol tartrate (LOPRESSOR) 25 MG tablet Take 12.5 mg by mouth daily.   Yes Historical Provider, MD  omeprazole (PRILOSEC) 20 MG capsule Take 20 mg by mouth daily.   Yes Historical Provider, MD  vitamin B-12 (CYANOCOBALAMIN) 1000 MCG tablet Take 1,000 mcg by mouth daily.   Yes Historical Provider, MD   BP 152/91  Pulse 86  Temp(Src) 98.1 F (36.7 C) (Oral)  Resp 18  Ht 5' (1.524 m)  Wt 173 lb (78.472 kg)  BMI 33.79 kg/m2  SpO2 96% Physical Exam  Nursing note and vitals reviewed. Constitutional: She appears well-developed and well-nourished. No distress.  HENT:  Head: Normocephalic and atraumatic.  Mouth/Throat: Oropharynx is clear and moist. No oropharyngeal exudate.  Eyes: Conjunctivae and EOM are normal. Pupils are equal, round, and reactive to light.  Neck: Normal range of motion. Neck supple.  No C-spine pain.   Cardiovascular:  Normal rate, regular rhythm, normal heart sounds and intact distal pulses.   No murmur heard. Pulmonary/Chest: Effort normal and breath sounds normal. No respiratory distress.  Abdominal: Soft. There is no tenderness. There is no rebound and no guarding.  Musculoskeletal: Normal range of motion. She exhibits no edema and no tenderness.  Neurological: She is alert. No cranial nerve deficit. She exhibits normal muscle tone. Coordination normal.  No ataxia on finger to nose bilaterally. No pronator drift. 5/5 strength throughout. CN 2-12 intact. Equal grip strength. Sensation intact. Oriented x2 to place and person only.  Does not ambulate at baseline  Skin: Skin is warm.  Extensive bruising and  ecchymosis to right forehead and right orbital.   Psychiatric: She has a normal mood and affect. Her behavior is normal.    ED Course  Procedures (including critical care time)   Labs Review Labs Reviewed  CBC WITH DIFFERENTIAL - Abnormal; Notable for the following:    RBC 3.40 (*)    Hemoglobin 10.5 (*)    HCT 31.6 (*)    All other components within normal limits  BASIC METABOLIC PANEL - Abnormal; Notable for the following:    Sodium 134 (*)    Glucose, Bld 110 (*)    BUN 26 (*)    GFR calc non Af Amer 52 (*)    GFR calc Af Amer 60 (*)    All other components within normal limits  PROTIME-INR    Imaging Review Ct Head Wo Contrast  03/03/2014   ADDENDUM REPORT: 03/03/2014 16:54  ADDENDUM: These results were called by telephone at the time of interpretation on 03/03/2014 at 4:54 pm to Darl Pikes, RN who verbally acknowledged these results.   Electronically Signed   By: Marlan Palau M.D.   On: 03/03/2014 16:54   03/03/2014   CLINICAL DATA:  Fall with head injury  EXAM: CT HEAD WITHOUT CONTRAST  TECHNIQUE: Contiguous axial images were obtained from the base of the skull through the vertex without intravenous contrast.  COMPARISON:  CT 12/06/2009  FINDINGS: Generalized atrophy. Chronic microvascular ischemic change in the white matter.  Small subdural hematoma along the floor of the middle cranial fossa on the right extending along the tentorium. Small subdural hematoma lateral to the right temporal lobe.  Small subdural hematoma posterior to the clivus without mass effect on the brainstem.  Negative for acute infarct or mass.  Right periorbital and frontal soft tissue swelling and hematoma. Negative for skull fracture.  IMPRESSION: Right temporal hematoma extending along the floor of the middle cranial fossa to the tentorium. No shift of the midline structures. Small subdural hematoma along the skullbase. Negative for fracture.  Electronically Signed: By: Marlan Palau M.D. On: 03/03/2014 16:49    Ct Cervical Spine Wo Contrast  03/03/2014   CLINICAL DATA:  Recent neck and facial injury  EXAM: CT MAXILLOFACIAL WITHOUT CONTRAST  CT CERVICAL SPINE WITHOUT CONTRAST  TECHNIQUE: Multidetector CT imaging of the head, cervical spine, and maxillofacial structures were performed using the standard protocol without intravenous contrast. Multiplanar CT image reconstructions of the cervical spine and maxillofacial structures were also generated.  COMPARISON:  None.  FINDINGS: CT MAXILLOFACIAL FINDINGS  Soft tissue swelling is not along the calvarium on the right consistent with a recent injury. No acute bony abnormality is seen. The soft tissues are otherwise within normal limits. The paranasal sinuses are unremarkable. The orbits and their contents are within normal limits.  CT CERVICAL SPINE FINDINGS  Seven cervical segments are well  visualized. Vertebral body height is well maintained. No acute fracture or acute facet abnormality is noted. Multilevel disc space narrowing with osteophytic change is seen.  IMPRESSION: CT of the cervical spine: Multilevel degenerative change without acute abnormality.  CT of the maxillofacial bones: Soft tissue swelling along the right temporal region extending into the face consistent with a recent injury. No acute bony abnormality is seen.   Electronically Signed   By: Alcide Clever M.D.   On: 03/03/2014 20:49   Ct Maxillofacial Wo Cm  03/03/2014   CLINICAL DATA:  Recent neck and facial injury  EXAM: CT MAXILLOFACIAL WITHOUT CONTRAST  CT CERVICAL SPINE WITHOUT CONTRAST  TECHNIQUE: Multidetector CT imaging of the head, cervical spine, and maxillofacial structures were performed using the standard protocol without intravenous contrast. Multiplanar CT image reconstructions of the cervical spine and maxillofacial structures were also generated.  COMPARISON:  None.  FINDINGS: CT MAXILLOFACIAL FINDINGS  Soft tissue swelling is not along the calvarium on the right consistent with a  recent injury. No acute bony abnormality is seen. The soft tissues are otherwise within normal limits. The paranasal sinuses are unremarkable. The orbits and their contents are within normal limits.  CT CERVICAL SPINE FINDINGS  Seven cervical segments are well visualized. Vertebral body height is well maintained. No acute fracture or acute facet abnormality is noted. Multilevel disc space narrowing with osteophytic change is seen.  IMPRESSION: CT of the cervical spine: Multilevel degenerative change without acute abnormality.  CT of the maxillofacial bones: Soft tissue swelling along the right temporal region extending into the face consistent with a recent injury. No acute bony abnormality is seen.   Electronically Signed   By: Alcide Clever M.D.   On: 03/03/2014 20:49     EKG Interpretation None      MDM   Final diagnoses:  SDH (subdural hematoma)  Periorbital contusion, right, initial encounter   patient from nursing home after fall 2 days ago. Outpatient CT scan today showed multiple areas of subdural hematoma. She is awake and alert. She is extensive ecchymosis to her right orbit and forehead. He is moving all her extremities normally. She is not on anticoagulation.  CT head shows area of subdural hematoma. Discussed with Dr. Mikal Plane of neurosurgery who evaluated the patient's images. Her fall was 2 days ago. Patient is not on anticoagulation. He does not think she needs to be admitted to the hospital for repeat scanning. She is not an operative candidate.  CT face and C-spine are negative for fracture. Patient is at her baseline per family members. She is wheelchair-bound it does not ambulate. She is not on anticoagulation.  Discussed with the family that subdural hemorrhage is small and 1 days old. Her exam is normal. She is at her baseline mental status. Per Dr. Mikal Plane she does not need to be admitted for monitoring. She stable for return to her facility. Return precautions discussed  including confusion, vomiting or any other concerns.   BP 152/91  Pulse 86  Temp(Src) 98.1 F (36.7 C) (Oral)  Resp 18  Ht 5' (1.524 m)  Wt 173 lb (78.472 kg)  BMI 33.79 kg/m2  SpO2 96%  I personally performed the services described in this documentation, which was scribed in my presence. The recorded information has been reviewed and is accurate.   Glynn Octave, MD 03/04/14 [phone]

## 2014-03-03 NOTE — Progress Notes (Signed)
Patient ID: Dominique RilingCarrie M Miller, female   DOB: 08/25/1918, 80 y.o.   MRN: [id number] Film reviewed. She has no mass effect from the very small subdural hematoma. She is two days past the trauma. Ok to send to nursing home.

## 2014-03-03 NOTE — Discharge Instructions (Signed)
Subdural Hematoma The amount of bleeding in your head is small. There is no need for an operation. Followup with her doctor. Return to the ED for develop worsening headache, confusion, vomiting or any other concerns. A subdural hematoma is a collection of blood between the brain and its tough outermost membrane covering (the dura). Blood clots that form in this area push down on the brain and cause irritation. A subdural hematoma may cause parts of the brain to stop working and eventually cause death.  CAUSES A subdural hematoma is caused by bleeding from a ruptured blood vessel (hemorrhage). The bleeding results from trauma to the head, such as from a fall or motor vehicle accident. There are two types of subdural hemorrhages:  Acute. This type develops shortly after a serious blow to the head and causes blood to collect very quickly. If not diagnosed and treated promptly, severe brain injury or death can occur.  Chronic. This is when bleeding develops more slowly, over weeks or months. RISK FACTORS People at risk for subdural hematoma include older persons, infants, and alcoholics. SYMPTOMS An acute subdural hemorrhage develops over minutes to hours. Symptoms can include:  Temporary loss of consciousness.  Weakness of arms or legs on one side of the body.  Changes in vision or speech.  A severe headache.  Seizures.  Nausea and vomiting.  Increased sleepiness. A chronic subdural hemorrhage develops over weeks to months. Symptoms may develop slowly and produce less noticeable problems or changes. Symptoms include:  A mild headache.  A change in personality.  Loss of balance or difficulty walking.  Weakness, numbness, or tingling in the arms or legs.  Nausea or vomiting.  Memory loss.  Double vision.  Increased sleepiness. DIAGNOSIS Your health care provider will perform a thorough physical and neurological exam. A CT scan or MRI may also be done. If there is blood on  the scan, its color will help your health care provider determine how long the hemorrhage has been there. TREATMENT If the cause is an acute subdural hemorrhage, immediate treatment is needed. In many cases an emergency surgery is performed to drain accumulated blood or to remove the blood clot. Sometimes steroid or diuretic medicines or controlled breathing through a ventilator is needed to decrease pressure in the brain. This is especially true if there is any swelling of the brain. If the cause is a chronic subdural hemorrhage, treatment depends on a variety of factors. Sometimes no treatment is needed. If the subdural hematoma is small and causes minimal or no symptoms, you may be treated with bed rest, medicines, and observation. If the hemorrhage is large or if you have neurological symptoms, an emergency surgery is usually needed to remove the blood clot. People who develop a subdural hemorrhage are at risk of developing seizures, even after the subdural hematoma has been treated. You may be prescribed an anti-seizure (anticonvulsant) medicine for a year or longer. HOME CARE INSTRUCTIONS  Only take medicines as directed by your health care provider.  Rest if directed by your health care provider.  Keep all follow-up appointments with your health care provider.  If you play a contact sport such as football, hockey or soccer and you experienced a significant head injury, allow enough time for healing (up to 15 days) before you start playing again. A repeated injury that occurs during this fragile repair period is likely to result in hemorrhage. This is called the second impact syndrome. SEEK IMMEDIATE MEDICAL CARE IF:  You fall or experience  minor trauma to your head and you are taking blood thinners. If you are on any blood thinners even a very small injury can cause a subdural hematoma. You should not hesitate to seek medical attention regardless of how minor you think your symptoms  are.  You experience a head injury and have:  Drowsiness or a decrease in alertness.  Confusion or forgetfulness.  Slurred speech.  Irrational or aggressive behavior.  Numbness or paralysis in any part of the body.  A feeling of being sick to your stomach (nauseous) or you throw up (vomit).  Difficulty walking or poor coordination.  Double vision.  Seizures.  A bleeding disorder.  A history of heavy alcohol use.  Clear fluid draining from your nose or ears.  Personality changes.  Difficulty thinking.  Worsening symptoms. MAKE SURE YOU:  Understand these instructions.  Will watch your condition.  Will get help right away if you are not doing well or get worse. FOR MORE INFORMATION National Institute of Neurological Disorders and Stroke: [place]Automobile.co.ukwww.ninds.nih.gov American Association of Neurological Surgeons: www.neurosurgerytoday.org American Academy of Neurology (AAN): ComparePet.czwww.aan.com Brain Injury Association of America: www.biausa.org Document Released: 06/07/2004 Document Revised: 05/11/2013 Document Reviewed: 01/21/2013 [facility] Patient Information 2015 [place]. This information is not intended to replace advice given to you by your health care provider. Make sure you discuss any questions you have with your health care provider.

## 2014-03-03 NOTE — ED Notes (Signed)
Pt is resident of [facility], reportedly fell on Wednesday and hit her head.  Pt had large ecchymotic area to right forehead and left orbital area.  Pt's right eye is swollen almost shut.  Pt denies pain or other concerns.  Pt is to be admitted for observation as per dr Leanord Hawkingrobson.  Pt is awake, alert, oriented.

## 2014-03-09 ENCOUNTER — Other Ambulatory Visit: Payer: Self-pay | Admitting: *Deleted

## 2014-03-09 MED ORDER — ALPRAZOLAM 0.5 MG PO TABS: ORAL_TABLET | ORAL | Status: DC

## 2014-03-09 NOTE — Telephone Encounter (Signed)
[facility] 

## 2014-03-22 ENCOUNTER — Encounter: Payer: Self-pay | Admitting: Neurology

## 2014-03-22 ENCOUNTER — Ambulatory Visit (INDEPENDENT_AMBULATORY_CARE_PROVIDER_SITE_OTHER): Payer: Medicare Other | Admitting: Neurology

## 2014-03-22 VITALS — Temp 97.6°F

## 2014-03-22 DIAGNOSIS — W19XXXA Unspecified fall, initial encounter: Secondary | ICD-10-CM | POA: Insufficient documentation

## 2014-03-22 DIAGNOSIS — G309 Alzheimer's disease, unspecified: Principal | ICD-10-CM

## 2014-03-22 DIAGNOSIS — S0003XA Contusion of scalp, initial encounter: Secondary | ICD-10-CM | POA: Insufficient documentation

## 2014-03-22 DIAGNOSIS — F0280 Dementia in other diseases classified elsewhere without behavioral disturbance: Secondary | ICD-10-CM

## 2014-03-22 DIAGNOSIS — F0391 Unspecified dementia with behavioral disturbance: Secondary | ICD-10-CM | POA: Insufficient documentation

## 2014-03-22 DIAGNOSIS — W19XXXS Unspecified fall, sequela: Secondary | ICD-10-CM

## 2014-03-22 DIAGNOSIS — S0003XD Contusion of scalp, subsequent encounter: Secondary | ICD-10-CM

## 2014-03-22 DIAGNOSIS — F03918 Unspecified dementia, unspecified severity, with other behavioral disturbance: Secondary | ICD-10-CM

## 2014-03-22 HISTORY — DX: Contusion of scalp, initial encounter: S00.03XA

## 2014-03-22 MED ORDER — MEMANTINE HCL ER 7 & 14 & 21 &28 MG PO CP24: 7.0000 mg | ORAL_CAPSULE | ORAL | Status: DC

## 2014-03-22 NOTE — Patient Instructions (Signed)
I had a long discussion with the patient's daughter and caregiver regarding her advanced Alzheimer's dementia with behavioral agitation and discuss available treatment options and answered questions. Continue valproic acid for agitation and trial of Namenda a starter pack to help with her dementia tolerated. No further neurological testing is necessary as patient would be highly uncooperative for them. Return for followup in 3 months with Charlott Holler, NP or call earlier if necessary.  Alzheimer Disease Alzheimer disease is a mental disorder. It causes memory loss and loss of other mental functions, such as learning, thinking, problem solving, communicating, and completing tasks. The mental losses interfere with the ability to perform daily activities at work, at home, or in social situations. Alzheimer disease usually starts in a person's late 73s or early 70s but can start earlier in life (familial form). The mental changes caused by this disease are permanent and worsen over time. As the illness progresses, the ability to do even the simplest things is lost. Survival with Alzheimer disease ranges from several years to as long as 20 years. CAUSES Alzheimer disease is caused by abnormally high levels of a protein (beta-amyloid) in the brain. This protein forms very small deposits within and around the brain's nerve cells. These deposits prevent the nerve cells from working properly. Experts are not certain what causes the beta-amyloid deposits in this disease. RISK FACTORS The following major risk factors have been identified:  Increasing age.  Certain genetic variations, such as Down syndrome (trisomy 21). SYMPTOMS In the early stages of Alzheimer disease, you are still able to perform daily activities but need greater effort, more time, or memory aids. Early symptoms include:  Mild memory loss of recent events, names, or phone numbers.  Loss of objects.  Minor loss of vocabulary.  Difficulty  with complex tasks, such as paying bills or driving in unfamiliar locations. Other mental functions deteriorate as the disease worsens. These changes slowly go from mild to severe. Symptoms at this stage include:  Difficulty remembering. You may not be able to recall personal information such as your address and telephone number. You may become confused about the date, the season of the year, or your location.  Difficulty maintaining attention. You may forget what you wanted to say during conversations and repeat what you have already said.  Difficulty learning new information or tasks. You may not remember what you read or the name of a new friend you met.  Difficulty counting or doing math. You may have difficulty with complex math problems. You may make mistakes in paying bills or managing your checkbook.  Poor reasoning and judgment. You may make poor decisions or not dress right for the weather.  Difficulty communicating. You may have regular difficulty remembering words, naming objects, expressing yourself clearly, or writing sentences that make sense.  Difficulty performing familiar daily activities. You may get lost driving in familiar locations or need help eating, bathing, dressing, grooming, or using the toilet. You may have difficulty maintaining bladder or bowel control.  Difficulty recognizing familiar faces. You may confuse family members or close friends with one another. You may not recognize a close relative or may mistake strangers for family. Alzheimer disease also may cause changes in personality and behavior. These changes include:   Loss of interest or motivation.  Social withdrawal.  Anxiety.  Difficulty sleeping.  Uncharacteristic anger or combativeness.  A false belief that someone is trying to harm you (paranoia).  Seeing things that are not real (hallucinations).  Agitation. Confusion  and disruptive behavior are often worse at night and may be triggered  by changes in the environment or acute medical issues. DIAGNOSIS  Alzheimer disease is diagnosed through an assessment by your health care provider. During this assessment, your health care provider will do the following:  Ask you and your family, friends, or caregivers questions about your symptoms, their frequency, their duration and progression, and the effect they are having on your life.  Ask questions about your personal and family medical history and use of alcohol or drugs, including prescription medicine.  Perform a physical exam and order blood tests and brain imaging exams. Your health care provider may refer you to a specialist for detailed evaluation of your mental functions (neuropsychological testing).  Many different brain disorders, medical conditions, and certain substances can cause symptoms that resemble Alzheimer disease symptoms. These must be ruled out before this disease can be diagnosed. If Alzheimer disease is diagnosed, it will be considered either "possible" or "probable" Alzheimer disease. "Possible" Alzheimer disease means that your symptoms are typical of the disease and no other disorder is causing them. "Probable" Alzheimer disease means that you also have a family history of the disease or genetic test results that support the diagnosis. Certain tests, mostly used in research studies, are highly specific for Alzheimer disease.  TREATMENT  There is currently no cure for this disease. The goals of treatment are to:  Slow down the progression of the disease.  Preserve mental function as long as possible.  Manage behavioral symptoms.  Make life easier for the person with Alzheimer disease and his or her caregivers. The following treatment options are available:  Medicine. Certain medicines may help slow memory loss by changing the level of certain chemicals in the brain. Medicine may also help with behavioral symptoms.  Talk therapy. Talk therapy provides  education, support, and memory aids for people with this disease. It is most effective in the early stages of the illness.  Caregiving. Caregivers may be family members, friends, or trained medical professionals. They help the person with Alzheimer disease with daily life activities. Caregiving may take place at home or at a nursing facility.  Family support groups. These provide education, emotional support, and information about community resources to family members who are taking care of the person with this disease. Document Released: 04/01/2004 Document Revised: 12/05/2013 Document Reviewed: 11/26/2012 [facility] Patient Information 2015 [place], [place]. This information is not intended to replace advice given to you by your health care provider. Make sure you discuss any questions you have with your health care provider.

## 2014-03-23 NOTE — Progress Notes (Signed)
[facility] [address] [place]. [place] [address] [phone]       OFFICE CONSULT NOTE  [name] Date of Birth:  [birth year] Medical Record Number:  [id number]   Referring MD:  Baltazar Najjar  Reason for Referral:  dementia  HPI: 27 year Caucasian lady with advanced Alzheimer`s dementia who lives in a nursing home for last several years and is unable to provide history which is obtained from the daughter as well as caregiver who accompanied her. She she hit she she had a fall 2 months ago and was in the hospital and found to have elevated TSH of 28 and medications adjusted. She was home on the floor nursing home complained of a headache. She was found to have bladder infection which was treated with. She has had some dementia and behavioral issues for which she is on valproic acid. His unclear whether she has ever been tried on Aricept-like medications or  Namenda in the past. Her daughter is not aware and side effects with these medications. She has had no history of known seizures, strokes or TIAs. Is no family history of  dementia  ROS:   14 system review of systems is positive for memory loss, fall, balance problems, agitation and all the systems negative  PMH:  Past Medical History  Diagnosis Date  . Dementia   . Hypothyroidism   . Anemia     Social History:  History   Social History  . Marital Status: Widowed    Spouse Name: N/A    Number of Children: 12  . Years of Education: 9th   Occupational History  . Retired    Social History Main Topics  . Smoking status: Never Smoker   . Smokeless tobacco: Never Used  . Alcohol Use: No  . Drug Use: No  . Sexual Activity: Not on file   Other Topics Concern  . Not on file   Social History Narrative   Patient lives at  [facility].   Caffeine Use: occasionally    Medications:   Current Outpatient Prescriptions on File Prior to Visit  Medication Sig Dispense Refill  .  ALPRAZolam (XANAX) 0.5 MG tablet Take one tablet by mouth twice daily for anxiety  60 tablet  5  . divalproex (DEPAKOTE SPRINKLE) 125 MG capsule Take 250 mg by mouth 2 (two) times daily.      . folic acid (FOLVITE) 800 MCG tablet Take 800 mcg by mouth daily.      Marland Kitchen levothyroxine (SYNTHROID, LEVOTHROID) 125 MCG tablet Take 125 mcg by mouth daily.      Marland Kitchen lisinopril (PRINIVIL,ZESTRIL) 2.5 MG tablet Take 2.5 mg by mouth daily.      Marland Kitchen loratadine (CLARITIN) 10 MG tablet Take 10 mg by mouth daily.      Marland Kitchen omeprazole (PRILOSEC) 20 MG capsule Take 20 mg by mouth daily.      . vitamin B-12 (CYANOCOBALAMIN) 1000 MCG tablet Take 1,000 mcg by mouth daily.       No current facility-administered medications on file prior to visit.    Allergies:  No Known Allergies  Physical Exam General: frail elderly caucasian lady, seated, in no evident distress Head: head normocephalic and atraumatic.   Neck: supple with no carotid or supraclavicular bruits Cardiovascular: regular rate and rhythm, no murmurs Musculoskeletal: no deformity Skin:  no rash/petichiae. Cystic right frontal  lesion with scab on the surface likely resolving scalp hematoma. Yellowish colored petechiae in the right maxillary  region Vascular:  Normal pulses all extremities Filed Vitals:   03/22/14 1254  Temp: 97.6 F (36.4 C)    Neurologic Exam Mental Status: Awake and fully alert. disoriented to place and time. . Attention span, concentration and fund of knowledge poor. Cooperative for detailed cognitive testing and will not cooperate for Mini-Mental status exam. She is extremely hard of hearing and at times difficult to communicate with. She will follow midline and simple one-step commands.  Cranial Nerves: Fundoscopic exam unable to perform due to patient being uncooperative . Pupils equal, briskly reactive to light. Extraocular movements full without nystagmus. Visual fields full to confrontation. Hearing poor bilaterally. Facial sensation  intact. Face, tongue, palate moves normally and symmetrically.  Motor: Normal bulk and tone. Normal strength in all tested extremity muscles. Sensory.: intact to touch and pinprick and not cooperative to test position and vibration..  Coordination: Rapid alternating movements unable to testl in all extremities.   Gait and Station:deferred as patient in wheelchair and is fall risk Reflexes: 1+ and symmetric. Toes downgoing.   ASSESSMENT:  [age] year lady with advanced Alzheimer's dementia with behavioral agitation. Recent falls 03/03/14 with resolving right frontal and maxillary scalp and facial hematoma which is resolving   PLAN: I had a long discussion with the patient's daughter and caregiver regarding her advanced Alzheimer's dementia with behavioral agitation and discuss available treatment options and answered questions. Continue valproic acid for agitation and trial of Namenda a starter pack to help with her dementia tolerated. No further neurological testing is necessary as patient would be highly uncooperative for them. Return for followup in 3 months with Heide GuileLynn Lam, NP or call earlier if necessary.    Note: This document was prepared with digital dictation and possible smart phrase technology. Any transcriptional errors that result from this process are unintentional.

## 2014-03-24 ENCOUNTER — Other Ambulatory Visit: Payer: Self-pay | Admitting: Neurology

## 2014-03-24 ENCOUNTER — Telehealth: Payer: Self-pay | Admitting: Neurology

## 2014-03-24 DIAGNOSIS — G309 Alzheimer's disease, unspecified: Principal | ICD-10-CM

## 2014-03-24 DIAGNOSIS — F0280 Dementia in other diseases classified elsewhere without behavioral disturbance: Secondary | ICD-10-CM

## 2014-03-24 MED ORDER — MEMANTINE HCL ER 7 & 14 & 21 &28 MG PO CP24: ORAL_CAPSULE | ORAL | Status: DC

## 2014-03-24 NOTE — Telephone Encounter (Signed)
Dominique CommanderJean Webber from [facility] calling to state that they received an order for patient for Namenda starter pack and she nor the nurse practitioner has never heard of it, please return call and advise.

## 2014-03-24 NOTE — Telephone Encounter (Signed)
Called jean back and let her know  Dr. Pearlean BrownieSethi advised for the facility to come get a  Sample starter pack and the final dose will be 28 xr qd. Patient showed understanding.

## 2014-04-15 ENCOUNTER — Encounter (HOSPITAL_COMMUNITY): Payer: Self-pay | Admitting: Emergency Medicine

## 2014-04-15 ENCOUNTER — Emergency Department (HOSPITAL_COMMUNITY)
Admission: EM | Admit: 2014-04-15 | Discharge: 2014-04-16 | Disposition: A | Discharge status: Home / Self Care | Payer: PRIVATE HEALTH INSURANCE | Attending: Emergency Medicine | Admitting: Emergency Medicine

## 2014-04-15 ENCOUNTER — Emergency Department (HOSPITAL_COMMUNITY): Payer: PRIVATE HEALTH INSURANCE

## 2014-04-15 DIAGNOSIS — I6200 Nontraumatic subdural hemorrhage, unspecified: Secondary | ICD-10-CM | POA: Diagnosis not present

## 2014-04-15 DIAGNOSIS — Z79899 Other long term (current) drug therapy: Secondary | ICD-10-CM | POA: Insufficient documentation

## 2014-04-15 DIAGNOSIS — D649 Anemia, unspecified: Secondary | ICD-10-CM | POA: Diagnosis not present

## 2014-04-15 DIAGNOSIS — E039 Hypothyroidism, unspecified: Secondary | ICD-10-CM | POA: Diagnosis not present

## 2014-04-15 DIAGNOSIS — F0390 Unspecified dementia without behavioral disturbance: Secondary | ICD-10-CM | POA: Insufficient documentation

## 2014-04-15 DIAGNOSIS — M6281 Muscle weakness (generalized): Secondary | ICD-10-CM | POA: Diagnosis present

## 2014-04-15 NOTE — ED Notes (Signed)
Pt waiting for [facility] to come transport her back to [facility].

## 2014-04-15 NOTE — ED Provider Notes (Signed)
CSN: [id number]     Arrival date & time 04/15/14  1726 History   First MD Initiated Contact with Patient 04/15/14 1728    This chart was scribed for Benny Lennert, MD by Marica Otter, ED Scribe. This patient was seen in room APA01/APA01 and the patient's care was started at 5:54 PM. LEVEL 5 CAVEAT: DEMENTIA  Chief Complaint  Patient presents with  . Extremity Weakness   Patient is a 80 y.o. female presenting with extremity weakness. The history is provided by the patient, the EMS personnel and the nursing home. No language interpreter was used.  Extremity Weakness This is a new problem. The current episode started more than 2 days ago.   PCP: PROVIDER NOT IN SYSTEM  HPI Comments: Dominique Miller is a 80 y.o. female, who cannot ambulate with medical Hx noted below, brought in by ambulance, who resides at the nursing home, [facility], who presents to the Emergency Department complaining of left arm weakness onset 6 days ago. Family member reports that pt was unable to grip her left hand last Sunday and unable to hold a glass of water with her left hand. Pt's nursing home reports they have not noticed any weakness in pt's extremities. Per EMS, pt's family insisted that pt come to the ED today for evaluation. Pt denies any present pain and states that she feels fine.   Past Medical History  Diagnosis Date  . Dementia   . Hypothyroidism   . Anemia    Past Surgical History  Procedure Laterality Date  . No past surgeries     Family History  Problem Relation Age of Onset  . Heart attack Mother   . Heart attack Father    History  Substance Use Topics  . Smoking status: Never Smoker   . Smokeless tobacco: Never Used  . Alcohol Use: No   OB History   Grav Para Term Preterm Abortions TAB SAB Ect Mult Living                 Review of Systems  Unable to perform ROS: Dementia  Musculoskeletal: Positive for extremity weakness.  Neurological: Weakness: left arm weakness.  All other  systems reviewed and are negative.  Allergies  Review of patient's allergies indicates no known allergies.  Home Medications   Prior to Admission medications   Medication Sig Start Date End Date Taking? Authorizing Provider  ALPRAZolam Prudy Feeler) 0.5 MG tablet Take one tablet by mouth twice daily for anxiety 03/09/14   Tiffany L Reed, DO  alum & mag hydroxide-simeth (MINTOX) 200-200-20 MG/5ML suspension Take 30 mLs by mouth every 6 (six) hours as needed for indigestion or heartburn.    Historical Provider, MD  calcium-vitamin D (OSCAL WITH D) 500-200 MG-UNIT per tablet Take 1 tablet by mouth 3 (three) times daily.    Historical Provider, MD  Cholecalciferol (VITAMIN D-3 PO) Take 50,000 Units by mouth every 30 (thirty) days.    Historical Provider, MD  divalproex (DEPAKOTE SPRINKLE) 125 MG capsule Take 250 mg by mouth 2 (two) times daily.    Historical Provider, MD  DULoxetine (CYMBALTA) 30 MG capsule Take 30 mg by mouth daily.    Historical Provider, MD  DULoxetine (CYMBALTA) 60 MG capsule Take 60 mg by mouth daily.    Historical Provider, MD  folic acid (FOLVITE) 800 MCG tablet Take 800 mcg by mouth daily.    Historical Provider, MD  guaiFENesin-codeine (ROBAFEN AC) 100-10 MG/5ML syrup Take 5 mLs by mouth 3 (  three) times daily as needed for cough.    Historical Provider, MD  levothyroxine (SYNTHROID, LEVOTHROID) 100 MCG tablet Take 100 mcg by mouth daily before breakfast.    Historical Provider, MD  levothyroxine (SYNTHROID, LEVOTHROID) 125 MCG tablet Take 125 mcg by mouth daily.    Historical Provider, MD  lisinopril (PRINIVIL,ZESTRIL) 2.5 MG tablet Take 2.5 mg by mouth daily.    Historical Provider, MD  loratadine (CLARITIN) 10 MG tablet Take 10 mg by mouth daily.    Historical Provider, MD  magnesium hydroxide (MILK OF MAGNESIA) 400 MG/5ML suspension Take 5 mLs by mouth daily as needed for mild constipation.    Historical Provider, MD  Memantine HCl ER (NAMENDA XR TITRATION PACK) 7 & 14 & 21  &28 MG CP24 Take as directed 03/24/14   Delia Heady, MD  Memantine HCl ER 7 & 14 & 21 &28 MG CP24 Take 7 mg by mouth 1 day or 1 dose. 03/22/14   Delia Heady, MD  Metoprolol-Hydrochlorothiazide 100-12.5 MG TB24 Take 1 tablet by mouth every morning.    Historical Provider, MD  omeprazole (PRILOSEC) 20 MG capsule Take 20 mg by mouth daily.    Historical Provider, MD  promethazine (PHENERGAN) 25 MG tablet Take 25 mg by mouth every 6 (six) hours as needed for nausea or vomiting.    Historical Provider, MD  vitamin B-12 (CYANOCOBALAMIN) 1000 MCG tablet Take 1,000 mcg by mouth daily.    Historical Provider, MD   Triage Vitals: BP 97/77  Pulse 77  Temp(Src) 98.5 F (36.9 C) (Oral)  Resp 18  Wt 173 lb (78.472 kg)  SpO2 98% Physical Exam  Constitutional: She is oriented to person, place, and time. She appears well-developed.  HENT:  Head: Normocephalic.  Eyes: Conjunctivae and EOM are normal. No scleral icterus.  Neck: Neck supple. No thyromegaly present.  Cardiovascular: Normal rate and regular rhythm.  Exam reveals no gallop and no friction rub.   No murmur heard. Pulmonary/Chest: No stridor. She has no wheezes. She has no rales. She exhibits no tenderness.  Abdominal: She exhibits no distension. There is no tenderness. There is no rebound.  Musculoskeletal: She exhibits no edema.  Lymphadenopathy:    She has no cervical adenopathy.  Neurological: She is oriented to person, place, and time. Coordination abnormal.  Alert and oriented x1 to person only. Moderate, general weakness throughout.   Skin: No rash noted. No erythema.  Psychiatric: She has a normal mood and affect. Her behavior is normal.    ED Course  Procedures (including critical care time) DIAGNOSTIC STUDIES: Oxygen Saturation is 98% on RA, nl by my interpretation.    COORDINATION OF CARE: 5:57 PM-Discussed treatment plan which includes head CT scan with pt and her family at bedside and they agreed to plan.   Labs  Review Labs Reviewed - No data to display  Imaging Review No results found.   EKG Interpretation None     CRITICAL CARE Performed by: Cai Anfinson L Total critical care time:40 Critical care time was exclusive of separately billable procedures and treating other patients. Critical care was necessary to treat or prevent imminent or life-threatening deterioration. Critical care was time spent personally by me on the following activities: development of treatment plan with patient and/or surrogate as well as nursing, discussions with consultants, evaluation of patient's response to treatment, examination of patient, obtaining history from patient or surrogate, ordering and performing treatments and interventions, ordering and review of laboratory studies, ordering and review of radiographic studies, pulse  oximetry and re-evaluation of patient's condition.  MDM   Final diagnoses:  None    Subdural.   I spoke with Dr. Marikay Alar neurosurgery and he stated the pt can follow up in the office this week or come to [facility] now.   The family decided to have the pt follow up with dr. Yetta Barre this week   The chart was scribed for me under my direct supervision.  I personally performed the history, physical, and medical decision making and all procedures in the evaluation of this patient.Benny Lennert, MD 04/15/14 2149

## 2014-04-15 NOTE — ED Notes (Signed)
Called c-com at this time pt ready to go back to [facility] at this time . Per c-com will be a transport delay because of no trucks. Vyctoria Dickman

## 2014-04-15 NOTE — ED Notes (Signed)
Per EMS family wanted pt to be seen for left arm weakness that has been going on for about a week, nursing home staff at [facility] has not noticed any weakness, pt is awake alert and oriented x 2. Pt follow commands and has equal grips bilaterally

## 2014-04-15 NOTE — Discharge Instructions (Signed)
Call dr. Marikay Alar office Monday to get an appointment to be seen this week.

## 2014-04-15 NOTE — ED Notes (Signed)
Pt denies any pain or other complaints at this time.

## 2014-04-15 NOTE — ED Notes (Signed)
Pt and family made aware by the EDP that he needed to speak with the neurologist. This nurse made pt and family aware that as soon as EDP speaks to the neurologist that he would advise me if the pt could eat a meal tray.

## 2014-04-16 NOTE — ED Notes (Signed)
Pt transferred back to [facility] via EMS.

## 2014-04-17 ENCOUNTER — Telehealth: Payer: Self-pay | Admitting: Nurse Practitioner

## 2014-04-17 NOTE — Telephone Encounter (Signed)
Dominique Miller from [facility] calling to request a sooner appointment for patient, states that patient went to the hospital over the weekend and she is still bleeding from her head after a fall, please return call and advise.

## 2014-05-08 ENCOUNTER — Other Ambulatory Visit: Payer: Self-pay | Admitting: *Deleted

## 2014-05-08 MED ORDER — ALPRAZOLAM 0.5 MG PO TABS: ORAL_TABLET | ORAL | Status: DC

## 2014-05-08 NOTE — Telephone Encounter (Signed)
[facility] 

## 2014-05-22 ENCOUNTER — Other Ambulatory Visit: Payer: Self-pay | Admitting: *Deleted

## 2014-05-22 MED ORDER — ALPRAZOLAM 0.5 MG PO TABS: ORAL_TABLET | ORAL | Status: DC

## 2014-05-22 NOTE — Telephone Encounter (Signed)
[facility] 

## 2014-06-05 ENCOUNTER — Encounter: Payer: Self-pay | Admitting: Internal Medicine

## 2014-06-05 ENCOUNTER — Non-Acute Institutional Stay (SKILLED_NURSING_FACILITY): Payer: Medicare Other | Admitting: Internal Medicine

## 2014-06-05 DIAGNOSIS — T83511A Infection and inflammatory reaction due to indwelling urethral catheter, initial encounter: Secondary | ICD-10-CM

## 2014-06-05 DIAGNOSIS — G308 Other Alzheimer's disease: Secondary | ICD-10-CM

## 2014-06-05 DIAGNOSIS — S065X9A Traumatic subdural hemorrhage with loss of consciousness of unspecified duration, initial encounter: Secondary | ICD-10-CM

## 2014-06-05 DIAGNOSIS — T8351XA Infection and inflammatory reaction due to indwelling urinary catheter, initial encounter: Secondary | ICD-10-CM

## 2014-06-05 DIAGNOSIS — I6200 Nontraumatic subdural hemorrhage, unspecified: Secondary | ICD-10-CM

## 2014-06-05 DIAGNOSIS — N390 Urinary tract infection, site not specified: Secondary | ICD-10-CM

## 2014-06-05 DIAGNOSIS — G309 Alzheimer's disease, unspecified: Secondary | ICD-10-CM

## 2014-06-05 DIAGNOSIS — F0281 Dementia in other diseases classified elsewhere with behavioral disturbance: Secondary | ICD-10-CM

## 2014-06-05 DIAGNOSIS — S065XAA Traumatic subdural hemorrhage with loss of consciousness status unknown, initial encounter: Secondary | ICD-10-CM

## 2014-06-05 NOTE — Progress Notes (Signed)
This encounter was created in error - please disregard.

## 2014-06-08 NOTE — Progress Notes (Addendum)
Patient ID: Dominique Miller, female   DOB: 03/12/1919, 80 y.o.   MRN: [id number]               PROGRESS NOTE  DATE:  06/05/2014    [facility]AC[facility]: [name]    LEVEL OF CARE:   SNF   Routine Visit   CHIEF COMPLAINT:  Review of general medical issues, [employer] visit.    HISTORY OF PRESENT ILLNESS:  This is a patient who came to the facility in the summer of 2014.  She came from another nursing home.    She has dementia with behavioral disturbances.  She was found to have a TSH level that was 28 and her medications were adjusted.    She is a diabetic.    She probably has COPD.    She was discovered to have a UTI last week.  She was treated with Cipro.    Also, she was over in the ER last month and was discovered to have a left-sided subdural hematoma, felt to be secondary to a fall in July 2015.  She had increasing confusion.    CURRENT MEDICATIONS:  Medication list is reviewed.     Lopressor 12.5 daily.    Claritin 10 q.d.    Zestril 2.5 daily.    Folic acid 800 mcg daily.    Vitamin B12, 1000 daily.    Synthroid 125 daily.    Cymbalta 30 q.d.    Namenda, currently titrating upwards at 28 mg daily.    Depakote 250 b.i.d.    Xanax 0.5 q.a.m.    Prilosec 20 q.d.    Vitamin D3, 50,000 U daily.    PHYSICAL EXAMINATION:   GENERAL APPEARANCE:  The patient is not in any distress.  Somewhat loud and resistant to examination.    CHEST/RESPIRATORY:  Clear air entry bilaterally.   CARDIOVASCULAR:  CARDIAC:   Heart sounds are normal.  There are no murmurs.   GASTROINTESTINAL:  ABDOMEN:   No masses.  Nontender.   NEUROLOGICAL:   She is restless, anxious, apparently occasionally paranoid.    ASSESSMENT/PLAN:    Alzheimer's disease with behavioral issues.  Mini-mental status score was 13 in July 2015.  She was started on Namenda, which is reasonable.  She is not compliant with medications.    She was treated recently with Cipro for a UTI.    Recent traumatic subdural  hematoma after a fall.  Felt to be in July.    Probable COPD.     Hypothyroidism.  On replacement.   Her TSH last checked was found to be in normal range on October 21st.    Mild normochromic, normocytic anemia.  Hemoglobin last checked on October 21st was 10.4.  The rest of her CBC was normal.

## 2014-06-16 ENCOUNTER — Encounter: Payer: Self-pay | Admitting: Nurse Practitioner

## 2014-06-16 ENCOUNTER — Ambulatory Visit (INDEPENDENT_AMBULATORY_CARE_PROVIDER_SITE_OTHER): Payer: Medicare Other | Admitting: Nurse Practitioner

## 2014-06-16 VITALS — BP 114/59 | HR 66 | Temp 95.6°F

## 2014-06-16 DIAGNOSIS — W19XXXS Unspecified fall, sequela: Secondary | ICD-10-CM

## 2014-06-16 DIAGNOSIS — F03918 Unspecified dementia, unspecified severity, with other behavioral disturbance: Secondary | ICD-10-CM

## 2014-06-16 DIAGNOSIS — F0391 Unspecified dementia with behavioral disturbance: Secondary | ICD-10-CM

## 2014-06-16 DIAGNOSIS — S065X0D Traumatic subdural hemorrhage without loss of consciousness, subsequent encounter: Secondary | ICD-10-CM

## 2014-06-16 NOTE — Progress Notes (Signed)
PATIENT: Dominique Miller DOB: 04/25/1919  REASON FOR VISIT: routine follow up for advanced dementia, subdural hematoma HISTORY FROM: patient and daughter, medical records  HISTORY OF PRESENT ILLNESS: [age] year AA lady with advanced Alzheimer`s dementia who lives in a nursing home for last several years and is unable to provide history which is obtained from the daughter who accompanied her. She had a fall 4 months ago and was in the hospital and found to have elevated TSH of 28 and medications adjusted. She was home on the floor at nursing home, complained of a headache. She was found to have bladder infection which was treated. She has had some dementia and behavioral issues for which she is on valproic acid. It's unclear whether she has ever been tried on Aricept-like medications or Namenda in the past. Her daughter is not aware and side effects with these medications. She has had no history of known seizures, strokes or TIAs. Is no family history of dementia Also, [place] and was discovered to have a left-sided subdural hematoma, felt to be secondary to a fall in July 2015.  She had increasing confusion. Again with increased confusion and refusal to eat or take medications 2 weeks ago, found to have UTI. Since that time, back to her baseline. It is unclear whether she was tried on Namenda starter pack ordered at last visit, it is not on her medication list and her daughter has no knowledge of it.   REVIEW OF SYSTEMS: Full 14 system review of systems performed and notable only for: memory loss, agitation, behavior problem, confusion, depression, anxiety, hallucinations.  ALLERGIES: No Known Allergies  HOME MEDICATIONS: Outpatient Prescriptions Prior to Visit  Medication Sig Dispense Refill  . ALPRAZolam (XANAX) 0.5 MG tablet Take one tablet by mouth twice daily for anxiety 60 tablet 5  . aluminum & magnesium hydroxide-simethicone (MYLANTA) 500-450-40 MG/5ML suspension  Take 30 mLs by mouth every 6 (six) hours as needed for indigestion (do not exceed 4 doses in 24 hours).    . calcium-vitamin D (OSCAL WITH D) 500-200 MG-UNIT per tablet Take 1 tablet by mouth 2 (two) times daily.    . divalproex (DEPAKOTE SPRINKLE) 125 MG capsule Take 250 mg by mouth 2 (two) times daily.    . DULoxetine (CYMBALTA) 30 MG capsule Take 30 mg by mouth every other day.     . levothyroxine (SYNTHROID, LEVOTHROID) 125 MCG tablet Take 125 mcg by mouth daily.    Marland Kitchen. lisinopril (PRINIVIL,ZESTRIL) 2.5 MG tablet Take 2.5 mg by mouth daily.    Marland Kitchen. loratadine (CLARITIN) 10 MG tablet Take 10 mg by mouth daily.    Marland Kitchen. omeprazole (PRILOSEC) 20 MG capsule Take 20 mg by mouth daily.    . promethazine (PHENERGAN) 25 MG tablet Take 25 mg by mouth every 6 (six) hours as needed for nausea or vomiting.    . vitamin B-12 (CYANOCOBALAMIN) 1000 MCG tablet Take 1,000 mcg by mouth daily.    . folic acid (FOLVITE) 800 MCG tablet Take 800 mcg by mouth daily.    . metoprolol tartrate (LOPRESSOR) 25 MG tablet Take 12.5 mg by mouth daily.     No facility-administered medications prior to visit.    PHYSICAL EXAM Filed Vitals:   06/16/14 1005  BP: 114/59  Pulse: 66  Temp: 95.6 F (35.3 C)  TempSrc: Oral   Physical Exam General: frail elderly caucasian lady, seated, in no evident distress Head: head normocephalic and atraumatic.  Neck: supple with no carotid or supraclavicular bruits Cardiovascular: regular rate and rhythm, no murmurs Musculoskeletal: no deformity Skin:  no rash/petichiae. Resolving right frontal scalp hematoma.  Vascular:  Normal pulses all extremities  Neurologic Exam Mental Status: Awake and fully alert. disoriented to place and time. . Attention span, concentration and fund of knowledge poor. Uncooperative for detailed cognitive testing and will not cooperate for Mini-Mental status exam. She is extremely hard of hearing and at times difficult to communicate with. She will follow  midline and simple one-step commands.   Cranial Nerves: Fundoscopic exam unable to perform due to patient being uncooperative . Pupils equal, briskly reactive to light. Extraocular movements full without nystagmus. Visual fields full to confrontation. Hearing poor bilaterally. Facial sensation intact. Face, tongue, palate moves normally and symmetrically.   Motor: Normal bulk and tone. Normal strength in all tested extremity muscles. Sensory: intact to touch and pinprick and not cooperative to test position and vibration..   Coordination: Rapid alternating movements unable to testl in all extremities.    Gait and Station:deferred as patient in wheelchair and is fall risk Reflexes: 1+ and symmetric. Toes downgoing.   ASSESSMENT: [age] year lady with advanced Alzheimer's dementia with behavioral agitation. Recent falls 03/03/14 with resolving right frontal subdural hematoma which is resolving. Patient is back to her baseline. No further workup or testing is indicated.  PLAN: I had a long discussion with the patient's daughter regarding her advanced Alzheimer's dementia with behavioral agitation, recent sub-dural hematoma, and answered questions. Continue valproic acid for agitation. Namenda starter pack was given at last visit, not on current med list. No further neurological testing is necessary as patient would be highly uncooperative for them. Return for followup as needed.  Tawny AsalLYNN E. Reniya Mcclees, MSN, FNP-BC, A/GNP-C 06/16/2014, 10:24 AM [facility] [address], [address] [place], [place] [id number] [phone]  Note: This document was prepared with digital dictation and possible smart phrase technology. Any transcriptional errors that result from this process are unintentional.

## 2014-06-16 NOTE — Patient Instructions (Signed)
Subdural Hematoma will take weeks to months to resolve, she seems like she is doing just fine.  Worrysome symptoms are altered level of consciousness or lethargy.  Continue current medications.  Follow up in our office as needed.

## 2014-06-19 NOTE — Progress Notes (Signed)
I agree with the above plan 

## 2014-09-12 ENCOUNTER — Emergency Department (HOSPITAL_COMMUNITY): Payer: Medicare Other

## 2014-09-12 ENCOUNTER — Inpatient Hospital Stay (HOSPITAL_COMMUNITY)
Admission: EM | Admit: 2014-09-12 | Discharge: 2014-09-13 | DRG: 087 | Disposition: A | Discharge status: Skilled Nursing Facility | Payer: Medicare Other | Attending: Internal Medicine | Admitting: Internal Medicine

## 2014-09-12 ENCOUNTER — Encounter (HOSPITAL_COMMUNITY): Payer: Self-pay

## 2014-09-12 DIAGNOSIS — S065X0A Traumatic subdural hemorrhage without loss of consciousness, initial encounter: Secondary | ICD-10-CM | POA: Diagnosis present

## 2014-09-12 DIAGNOSIS — F0280 Dementia in other diseases classified elsewhere without behavioral disturbance: Secondary | ICD-10-CM | POA: Diagnosis present

## 2014-09-12 DIAGNOSIS — Z66 Do not resuscitate: Secondary | ICD-10-CM | POA: Diagnosis present

## 2014-09-12 DIAGNOSIS — Z8249 Family history of ischemic heart disease and other diseases of the circulatory system: Secondary | ICD-10-CM | POA: Diagnosis not present

## 2014-09-12 DIAGNOSIS — W06XXXA Fall from bed, initial encounter: Secondary | ICD-10-CM | POA: Diagnosis present

## 2014-09-12 DIAGNOSIS — S065XAA Traumatic subdural hemorrhage with loss of consciousness status unknown, initial encounter: Secondary | ICD-10-CM | POA: Diagnosis present

## 2014-09-12 DIAGNOSIS — S065X9A Traumatic subdural hemorrhage with loss of consciousness of unspecified duration, initial encounter: Secondary | ICD-10-CM

## 2014-09-12 DIAGNOSIS — R339 Retention of urine, unspecified: Secondary | ICD-10-CM | POA: Diagnosis present

## 2014-09-12 DIAGNOSIS — Y92129 Unspecified place in nursing home as the place of occurrence of the external cause: Secondary | ICD-10-CM | POA: Diagnosis not present

## 2014-09-12 DIAGNOSIS — I10 Essential (primary) hypertension: Secondary | ICD-10-CM | POA: Diagnosis present

## 2014-09-12 DIAGNOSIS — Z993 Dependence on wheelchair: Secondary | ICD-10-CM

## 2014-09-12 DIAGNOSIS — S0003XA Contusion of scalp, initial encounter: Secondary | ICD-10-CM | POA: Diagnosis present

## 2014-09-12 DIAGNOSIS — E119 Type 2 diabetes mellitus without complications: Secondary | ICD-10-CM | POA: Diagnosis present

## 2014-09-12 DIAGNOSIS — J449 Chronic obstructive pulmonary disease, unspecified: Secondary | ICD-10-CM | POA: Diagnosis present

## 2014-09-12 DIAGNOSIS — E039 Hypothyroidism, unspecified: Secondary | ICD-10-CM | POA: Diagnosis present

## 2014-09-12 DIAGNOSIS — G309 Alzheimer's disease, unspecified: Secondary | ICD-10-CM | POA: Diagnosis present

## 2014-09-12 DIAGNOSIS — R51 Headache: Secondary | ICD-10-CM | POA: Diagnosis present

## 2014-09-12 DIAGNOSIS — W19XXXA Unspecified fall, initial encounter: Secondary | ICD-10-CM | POA: Diagnosis present

## 2014-09-12 DIAGNOSIS — I6200 Nontraumatic subdural hemorrhage, unspecified: Secondary | ICD-10-CM

## 2014-09-12 HISTORY — DX: Type 2 diabetes mellitus without complications: E11.9

## 2014-09-12 HISTORY — DX: Traumatic subdural hemorrhage with loss of consciousness status unknown, initial encounter: S06.5XAA

## 2014-09-12 HISTORY — DX: Traumatic subdural hemorrhage with loss of consciousness of unspecified duration, initial encounter: S06.5X9A

## 2014-09-12 HISTORY — DX: Chronic obstructive pulmonary disease, unspecified: J44.9

## 2014-09-12 LAB — CBC WITH DIFFERENTIAL/PLATELET
Basophils Absolute: 0.1 10*3/uL (ref 0.0–0.1)
Basophils Relative: 1 % (ref 0–1)
EOS PCT: 3 % (ref 0–5)
Eosinophils Absolute: 0.2 10*3/uL (ref 0.0–0.7)
HCT: 33.2 % — ABNORMAL LOW (ref 36.0–46.0)
HEMOGLOBIN: 10.5 g/dL — AB (ref 12.0–15.0)
LYMPHS PCT: 27 % (ref 12–46)
Lymphs Abs: 1.9 10*3/uL (ref 0.7–4.0)
MCH: 29.3 pg (ref 26.0–34.0)
MCHC: 31.6 g/dL (ref 30.0–36.0)
MCV: 92.7 fL (ref 78.0–100.0)
Monocytes Absolute: 0.3 10*3/uL (ref 0.1–1.0)
Monocytes Relative: 4 % (ref 3–12)
NEUTROS PCT: 65 % (ref 43–77)
Neutro Abs: 4.6 10*3/uL (ref 1.7–7.7)
PLATELETS: 255 10*3/uL (ref 150–400)
RBC: 3.58 MIL/uL — ABNORMAL LOW (ref 3.87–5.11)
RDW: 14.7 % (ref 11.5–15.5)
WBC: 7.1 10*3/uL (ref 4.0–10.5)

## 2014-09-12 LAB — PROTIME-INR
INR: 1.09 (ref 0.00–1.49)
PROTHROMBIN TIME: 14.3 s (ref 11.6–15.2)

## 2014-09-12 LAB — MRSA PCR SCREENING: MRSA by PCR: POSITIVE — AB

## 2014-09-12 LAB — URINALYSIS, ROUTINE W REFLEX MICROSCOPIC
Bilirubin Urine: NEGATIVE
Glucose, UA: NEGATIVE mg/dL
HGB URINE DIPSTICK: NEGATIVE
Ketones, ur: NEGATIVE mg/dL
LEUKOCYTES UA: NEGATIVE
Nitrite: NEGATIVE
Protein, ur: NEGATIVE mg/dL
SPECIFIC GRAVITY, URINE: 1.015 (ref 1.005–1.030)
Urobilinogen, UA: 0.2 mg/dL (ref 0.0–1.0)
pH: 7 (ref 5.0–8.0)

## 2014-09-12 LAB — TSH: TSH: 7.423 u[IU]/mL — AB (ref 0.350–4.500)

## 2014-09-12 LAB — BASIC METABOLIC PANEL
Anion gap: 3 — ABNORMAL LOW (ref 5–15)
BUN: 20 mg/dL (ref 6–23)
CO2: 28 mmol/L (ref 19–32)
Calcium: 8.6 mg/dL (ref 8.4–10.5)
Chloride: 102 mmol/L (ref 96–112)
Creatinine, Ser: 0.66 mg/dL (ref 0.50–1.10)
GFR calc Af Amer: 86 mL/min — ABNORMAL LOW (ref >90)
GFR, EST NON AFRICAN AMERICAN: 74 mL/min — AB (ref >90)
GLUCOSE: 111 mg/dL — AB (ref 70–99)
POTASSIUM: 4 mmol/L (ref 3.5–5.1)
Sodium: 133 mmol/L — ABNORMAL LOW (ref 135–145)

## 2014-09-12 MED ORDER — TETANUS-DIPHTH-ACELL PERTUSSIS 5-2.5-18.5 LF-MCG/0.5 IM SUSP
0.5000 mL | Freq: Once | INTRAMUSCULAR | Status: AC
  Administered 2014-09-12: 0.5 mL via INTRAMUSCULAR
  Filled 2014-09-12: qty 0.5

## 2014-09-12 MED ORDER — ALPRAZOLAM 0.5 MG PO TABS
0.5000 mg | ORAL_TABLET | Freq: Two times a day (BID) | ORAL | Status: DC | PRN
  Administered 2014-09-12: 0.5 mg via ORAL
  Filled 2014-09-12: qty 1

## 2014-09-12 MED ORDER — VITAMIN B-12 1000 MCG PO TABS
1000.0000 ug | ORAL_TABLET | Freq: Every day | ORAL | Status: DC
  Administered 2014-09-12 – 2014-09-13 (×2): 1000 ug via ORAL
  Filled 2014-09-12 (×2): qty 1

## 2014-09-12 MED ORDER — FOLIC ACID 1 MG PO TABS
1.0000 mg | ORAL_TABLET | Freq: Every day | ORAL | Status: DC
  Administered 2014-09-12 – 2014-09-13 (×2): 1 mg via ORAL
  Filled 2014-09-12 (×2): qty 1

## 2014-09-12 MED ORDER — HALOPERIDOL LACTATE 5 MG/ML IJ SOLN
2.5000 mg | Freq: Once | INTRAMUSCULAR | Status: AC
Start: 2014-09-12 — End: 2014-09-12
  Administered 2014-09-12: 2.5 mg via INTRAVENOUS
  Filled 2014-09-12: qty 1

## 2014-09-12 MED ORDER — HYDROCODONE-ACETAMINOPHEN 5-325 MG PO TABS
1.0000 | ORAL_TABLET | Freq: Three times a day (TID) | ORAL | Status: DC | PRN
  Administered 2014-09-12: 1 via ORAL
  Filled 2014-09-12: qty 1

## 2014-09-12 MED ORDER — VITAMIN D (ERGOCALCIFEROL) 1.25 MG (50000 UNIT) PO CAPS: 50000.0000 [IU] | ORAL_CAPSULE | ORAL | Status: DC

## 2014-09-12 MED ORDER — LEVOTHYROXINE SODIUM 25 MCG PO TABS: 125.0000 ug | ORAL_TABLET | Freq: Every day | ORAL | Status: DC

## 2014-09-12 MED ORDER — CHLORHEXIDINE GLUCONATE CLOTH 2 % EX PADS: 6.0000 | MEDICATED_PAD | Freq: Every day | CUTANEOUS | Status: DC

## 2014-09-12 MED ORDER — MEMANTINE HCL ER 28 MG PO CP24
28.0000 mg | ORAL_CAPSULE | Freq: Every day | ORAL | Status: DC
  Administered 2014-09-12 – 2014-09-13 (×2): 28 mg via ORAL
  Filled 2014-09-12 (×4): qty 1

## 2014-09-12 MED ORDER — MUPIROCIN 2 % EX OINT
TOPICAL_OINTMENT | Freq: Two times a day (BID) | CUTANEOUS | Status: DC
  Administered 2014-09-12 – 2014-09-13 (×2): via NASAL
  Filled 2014-09-12: qty 22

## 2014-09-12 MED ORDER — METOPROLOL TARTRATE 25 MG PO TABS
12.5000 mg | ORAL_TABLET | Freq: Every day | ORAL | Status: DC
  Administered 2014-09-12 – 2014-09-13 (×2): 12.5 mg via ORAL
  Filled 2014-09-12 (×2): qty 1

## 2014-09-12 MED ORDER — HYDRALAZINE HCL 20 MG/ML IJ SOLN: 10.0000 mg | Freq: Four times a day (QID) | INTRAMUSCULAR | Status: DC | PRN

## 2014-09-12 MED ORDER — AMLODIPINE BESYLATE 5 MG PO TABS
5.0000 mg | ORAL_TABLET | Freq: Every day | ORAL | Status: DC
Start: 2014-09-12 — End: 2014-09-13
  Administered 2014-09-12 – 2014-09-13 (×2): 5 mg via ORAL
  Filled 2014-09-12 (×2): qty 1

## 2014-09-12 MED ORDER — DIVALPROEX SODIUM 125 MG PO CPSP
250.0000 mg | ORAL_CAPSULE | Freq: Two times a day (BID) | ORAL | Status: DC
  Administered 2014-09-12 – 2014-09-13 (×2): 250 mg via ORAL
  Filled 2014-09-12 (×6): qty 2

## 2014-09-12 MED ORDER — LISINOPRIL 5 MG PO TABS
5.0000 mg | ORAL_TABLET | Freq: Every day | ORAL | Status: DC
  Administered 2014-09-12 – 2014-09-13 (×2): 5 mg via ORAL
  Filled 2014-09-12 (×2): qty 1

## 2014-09-12 MED ORDER — DULOXETINE HCL 30 MG PO CPEP
30.0000 mg | ORAL_CAPSULE | ORAL | Status: DC
  Administered 2014-09-12: 30 mg via ORAL
  Filled 2014-09-12: qty 1

## 2014-09-12 NOTE — H&P (Signed)
[name] History [facility] Physical  Dominique Miller ZOX:[id number] DOB: 12-04-1918 DOA: 09/12/2014  Referring physician:  PCP: PROVIDER NOT IN SYSTEM  Specialists:   Chief Complaint: fall   HPI: Dominique Miller is a 81 y.o. female with PMH of Advanced Dementia, wheelchair bound (3-4 years), recurrent falls, with h/o SDH presented with another episode of fall, found to have scalp hematoma, SDH. Per EMS, she fell out of bed in high position and fall was witnessed by staff at nursing, no LOC, no seizures; patient denies any new focal weakness, no chest pains, no SOB, no nausea, vomiting or diarrhea.  -ED discussed with Dr. Conchita Paris, on call for neurosurgery. He has reviewed the images and confirms that there is no neurosurgical intervention recommended. Recommendation at this time is hospitalization for observation for 24-48 hours, discharge home if no deterioration   Review of Systems: The patient denies anorexia, fever, weight loss,, vision loss, decreased hearing, hoarseness, chest pain, syncope, dyspnea on exertion, peripheral edema, balance deficits, hemoptysis, abdominal pain, melena, hematochezia, severe indigestion/heartburn, hematuria, incontinence, genital sores, muscle weakness, suspicious skin lesions, transient blindness, difficulty walking, depression, unusual weight change, abnormal bleeding, enlarged lymph nodes, angioedema, and breast masses.    Past Medical History  Diagnosis Date  . Dementia   . Hypothyroidism   . Anemia   . Diabetes mellitus without complication   . COPD (chronic obstructive pulmonary disease)    Past Surgical History  Procedure Laterality Date  . No past surgeries     Social History:  reports that she has never smoked. She has never used smokeless tobacco. She reports that she does not drink alcohol or use illicit drugs. No; where does patient live--home, ALF, SNF? and with whom if at home? SNF;  Can patient participate in ADLs?  No Known  Allergies  Family History  Problem Relation Age of Onset  . Heart attack Mother   . Heart attack Father     (be sure to complete)  Prior to Admission medications   Medication Sig Start Date End Date Taking? Authorizing Provider  ALPRAZolam Prudy Feeler) 0.5 MG tablet Take one tablet by mouth twice daily for anxiety Patient taking differently: Take 0.5 mg by mouth 2 (two) times daily.  05/22/14  Yes Tiffany L Reed, DO  calcium-vitamin D (OSCAL WITH D) 500-200 MG-UNIT per tablet Take 1 tablet by mouth 2 (two) times daily.   Yes Historical Provider, MD  divalproex (DEPAKOTE SPRINKLE) 125 MG capsule Take 250 mg by mouth 2 (two) times daily.   Yes Historical Provider, MD  DULoxetine (CYMBALTA) 30 MG capsule Take 30 mg by mouth every other day.    Yes Historical Provider, MD  folic acid (FOLVITE) 800 MCG tablet Take 800 mcg by mouth daily.   Yes Historical Provider, MD  HYDROcodone-acetaminophen (NORCO/VICODIN) 5-325 MG per tablet Take 1 tablet by mouth every 8 (eight) hours as needed (pain). For 1 month then D/C(started 09/04/14)   Yes Historical Provider, MD  levothyroxine (SYNTHROID, LEVOTHROID) 125 MCG tablet Take 125 mcg by mouth daily.   Yes Historical Provider, MD  lisinopril (PRINIVIL,ZESTRIL) 2.5 MG tablet Take 2.5 mg by mouth daily.   Yes Historical Provider, MD  loratadine (CLARITIN) 10 MG tablet Take 10 mg by mouth daily.   Yes Historical Provider, MD  Melatonin 3 MG TABS Take 1 tablet by mouth at bedtime.   Yes Historical Provider, MD  memantine (NAMENDA XR) 28 MG CP24 24 hr capsule Take 28 mg by mouth daily.   Yes  Historical Provider, MD  metoprolol tartrate (LOPRESSOR) 25 MG tablet Take 12.5 mg by mouth daily.   Yes Historical Provider, MD  omeprazole (PRILOSEC) 20 MG capsule Take 20 mg by mouth daily.   Yes Historical Provider, MD  vitamin B-12 (CYANOCOBALAMIN) 1000 MCG tablet Take 1,000 mcg by mouth daily.   Yes Historical Provider, MD  aluminum & magnesium hydroxide-simethicone (MYLANTA)  500-450-40 MG/5ML suspension Take 30 mLs by mouth every 6 (six) hours as needed for indigestion (do not exceed 4 doses in 24 hours).    Historical Provider, MD  Cholecalciferol (VITAMIN D3) 50000 UNITS CAPS Take 1 capsule by mouth every 30 (thirty) days.     Historical Provider, MD  promethazine (PHENERGAN) 25 MG tablet Take 25 mg by mouth every 6 (six) hours as needed for nausea or vomiting (Not to exceed 4 doses in 24 hours).     Historical Provider, MD   Physical Exam: Filed Vitals:   09/12/14 1300  BP: 157/82  Pulse: 80  Temp:   Resp: 11     General:  Alert, but confused at baseline   Eyes: EOM-I  ENT: no oral ulcers   Neck: supple  Cardiovascular: s1,s2 rrr  Respiratory: CTA BL  Abdomen: soft, nt,nd   Skin: hematoma   Musculoskeletal: no leg edema  Psychiatric: no hallucinations   Neurologic: alert, confused at baseline, otherwise CN 2-12 intact   Labs on Admission:  Basic Metabolic Panel:  Recent Labs Lab 09/12/14 1046  NA 133*  K 4.0  CL 102  CO2 28  GLUCOSE 111*  BUN 20  CREATININE 0.66  CALCIUM 8.6   Liver Function Tests: No results for input(s): AST, ALT, ALKPHOS, BILITOT, PROT, ALBUMIN in the last 168 hours. No results for input(s): LIPASE, AMYLASE in the last 168 hours. No results for input(s): AMMONIA in the last 168 hours. CBC:  Recent Labs Lab 09/12/14 1046  WBC 7.1  NEUTROABS 4.6  HGB 10.5*  HCT 33.2*  MCV 92.7  PLT 255   Cardiac Enzymes: No results for input(s): CKTOTAL, CKMB, CKMBINDEX, TROPONINI in the last 168 hours.  BNP (last 3 results) No results for input(s): BNP in the last 8760 hours.  ProBNP (last 3 results) No results for input(s): PROBNP in the last 8760 hours.  CBG: No results for input(s): GLUCAP in the last 168 hours.  Radiological Exams on Admission: Dg Chest 1 View  09/12/2014   CLINICAL DATA:  Pain following fall  EXAM: CHEST  1 VIEW  COMPARISON:  Dec 31, 2009  FINDINGS: There is extensive interstitial  fibrosis, stable. There is no frank edema or consolidation. Heart is mildly enlarged with pulmonary vascularity within normal limits, stable. No pneumothorax. No acute fracture apparent. There is degenerative change in both shoulders. Multiple compression fractures are noted in the thoracic spine, stable.  IMPRESSION: Extensive interstitial fibrosis. No frank edema or consolidation. Stable cardiomegaly. No pneumothorax.   Electronically Signed   By: Bretta Bang III M.D.   On: 09/12/2014 10:34   Dg Thoracic Spine 2 View  09/12/2014   CLINICAL DATA:  Trauma secondary to a fall today. Head trauma. Headache. Altered mental status.  EXAM: THORACIC SPINE - 2 VIEW  COMPARISON:  Chest x-ray dated 12/15/2003 and CT scan of the abdomen and pelvis dated 04/15/2014 and rib x-rays dated 12/31/2009  FINDINGS: There is an old compression fracture of T7, unchanged since 04/15/2014. There is no acute fracture. Degenerative osteophytes in the mid to lower thoracic spine. No disc space narrowing  or bone destruction.  Old healed right anterior rib fractures.  IMPRESSION: No acute abnormality of the thoracic spine. Old compression fracture of T7.   Electronically Signed   By: Francene Boyers M.D.   On: 09/12/2014 10:36   Dg Lumbar Spine Complete  09/12/2014   CLINICAL DATA:  81 year old female who fell out of bed, hematoma. Initial encounter.  EXAM: LUMBAR SPINE - COMPLETE 4+ VIEW  COMPARISON:  CT Abdomen and Pelvis 04/15/2014.  FINDINGS: Osteopenia and widespread bulky degenerative endplate changes in the spine. Normal lumbar segmentation depicted on comparison. Overall lumbar vertebral height and alignment appears stable. Grossly intact visualized lower thoracic levels. Advanced Aortoiliac calcified atherosclerosis noted. On the lateral view the sacrum appears to remain grossly intact. Partially visible hip arthroplasty.  IMPRESSION: No acute fracture or listhesis identified in the lumbar spine.   Electronically Signed   By:  Odessa Fleming M.D.   On: 09/12/2014 10:35   Dg Pelvis 1-2 Views  09/12/2014   CLINICAL DATA:  81 year old female who fell out of bed, hematoma. Initial encounter.  EXAM: PELVIS - 1-2 VIEW  COMPARISON:  CT Abdomen and Pelvis 04/15/2014 and earlier.  FINDINGS: Chronic right hip arthroplasty. Chronic osteopenia. Pubic rami and pubic symphysis appear stable. Iliac wings appear intact. Grossly stable sacrum. No acute pelvic fracture identified. Visible proximal femurs and femoral hardware appear grossly intact.  IMPRESSION: No acute fracture or dislocation identified about the pelvis. If there is lateralizing hip pain recommend dedicated hip series.   Electronically Signed   By: Odessa Fleming M.D.   On: 09/12/2014 10:36   Ct Head Wo Contrast  09/12/2014   CLINICAL DATA:  Fall from bed at nursing home. Hematoma over left eye. Headache.  EXAM: CT HEAD WITHOUT CONTRAST  CT CERVICAL SPINE WITHOUT CONTRAST  TECHNIQUE: Multidetector CT imaging of the head and cervical spine was performed following the standard protocol without intravenous contrast. Multiplanar CT image reconstructions of the cervical spine were also generated.  COMPARISON:  04/15/2014  FINDINGS: CT HEAD FINDINGS  Large left frontal scalp contusion/ hematoma. High-density material noted along the falx, likely subdural hematoma. Somewhat but tumor shaped high-density blood noted over the left temporal region. This is presumably subdural hematoma given the lack of overlying skull fracture, but cannot completely exclude epidural hematoma. Slight mass-effect on the underlying left cerebral hemisphere. Approximately 4 mm of left-to-right midline shift. No hydrocephalus.  Chronic microvascular disease throughout the cerebral hemispheres. No acute calvarial abnormality.  CT CERVICAL SPINE FINDINGS  Study quality is degraded by motion. Normal alignment. No definite fracture. Prevertebral soft tissues are normal.  IMPRESSION: Blood along the falx in the midline, likely subdural  blood. 4 mm thickness extra-axial blood collection over the left cerebral hemisphere in the region of the low left temporal lobe. I suspect this represents a subdural hematoma as well although it is slightly lenticular shape raising the possibility of epidural hematoma. No underlying calvarial fracture.  4 mm of left-to-right midline shift.  Chronic microvascular disease.  Cervical spine is degraded by motion. No definite acute bony abnormality.  Critical Value/emergent results were called by telephone at the time of interpretation on 09/12/2014 at 10:40 am to Dr. Jaci Carrel , who verbally acknowledged these results.   Electronically Signed   By: Charlett Nose M.D.   On: 09/12/2014 10:39   Ct Cervical Spine Wo Contrast  09/12/2014   CLINICAL DATA:  Fall from bed at nursing home. Hematoma over left eye. Headache.  EXAM: CT HEAD WITHOUT  CONTRAST  CT CERVICAL SPINE WITHOUT CONTRAST  TECHNIQUE: Multidetector CT imaging of the head and cervical spine was performed following the standard protocol without intravenous contrast. Multiplanar CT image reconstructions of the cervical spine were also generated.  COMPARISON:  04/15/2014  FINDINGS: CT HEAD FINDINGS  Large left frontal scalp contusion/ hematoma. High-density material noted along the falx, likely subdural hematoma. Somewhat but tumor shaped high-density blood noted over the left temporal region. This is presumably subdural hematoma given the lack of overlying skull fracture, but cannot completely exclude epidural hematoma. Slight mass-effect on the underlying left cerebral hemisphere. Approximately 4 mm of left-to-right midline shift. No hydrocephalus.  Chronic microvascular disease throughout the cerebral hemispheres. No acute calvarial abnormality.  CT CERVICAL SPINE FINDINGS  Study quality is degraded by motion. Normal alignment. No definite fracture. Prevertebral soft tissues are normal.  IMPRESSION: Blood along the falx in the midline, likely subdural  blood. 4 mm thickness extra-axial blood collection over the left cerebral hemisphere in the region of the low left temporal lobe. I suspect this represents a subdural hematoma as well although it is slightly lenticular shape raising the possibility of epidural hematoma. No underlying calvarial fracture.  4 mm of left-to-right midline shift.  Chronic microvascular disease.  Cervical spine is degraded by motion. No definite acute bony abnormality.  Critical Value/emergent results were called by telephone at the time of interpretation on 09/12/2014 at 10:40 am to Dr. Jaci CarrelHRISTOPHER POLLINA , who verbally acknowledged these results.   Electronically Signed   By: Charlett NoseKevin  Dover M.D.   On: 09/12/2014 10:39    EKG: Independently reviewed.   Assessment/Plan Principal Problem:   SDH (subdural hematoma) Active Problems:   Alzheimer's disease   Fall   Scalp hematoma  81 y.o. female with PMH of HTN, Advanced Dementia, wheelchair bound (3-4 years), recurrent falls, with h/o SDH presented with another episode of fall, found to have scalp hematoma, SDH.  1. Fall, SDH; CT head: 4 mm of left-to-right midline shift; ED d/w neurosurgery, who recommended observation; will repeat CT head AM, or change in mental status  -fall precautions, neuro checks   2. Advanced dementia, ataxic gait, inability to walk, recurrent falls;  -fall precautions, cont supportive care  3. Advanced pulmonary fibrosis; h/o tobacco use, factory work; no respiratory distress; bronchodilators prn  4. HTN, uncontrolled; resume home regimen, added amlodipine; prn hydralazine     D/w patient, her daughter; per her family patient never wanted aggressive care or resuscitation; daughters declined resuscitation in case of code -patient is DNR, confirmed with her family at the bedside   Neurosurgery by ED:  if consultant consulted, please document name and whether formally or informally consulted  Code Status: DNR (must indicate code status--if  unknown or must be presumed, indicate so) Family Communication: d/w patient, her daughters at the bedside  (indicate person spoken with, if applicable, with phone number if by telephone) Disposition Plan: SNF 24-48 hrs  (indicate anticipated LOS)  Time spent: >35 minutes   Esperanza SheetsBURIEV, Dominique Miller Pager [phone]  If 7PM-7AM, please contact night-coverage [email or web address] Password [email or web address] 09/12/2014, 1:50 PM

## 2014-09-12 NOTE — ED Notes (Signed)
cbg 107 per ems

## 2014-09-12 NOTE — ED Notes (Signed)
C collar removed

## 2014-09-12 NOTE — Progress Notes (Signed)
Patient very high fall risk. Bed alarm on, frequent contact made with patient. Family at bedside at this time. Patient has dementia, oriented only to self and family, and becomes anxious when alone in room.

## 2014-09-12 NOTE — ED Provider Notes (Signed)
CSN: [id number]     Arrival date & time 09/12/14  0927 History  This chart was scribed for Gilda Creasehristopher J. Pollina, MD by Tonye RoyaltyJoshua Chen, ED Scribe. This patient was seen in room APA04/APA04 and the patient's care was started at 9:36 AM.    Level 5 Caveat: Dementia   Chief Complaint  Patient presents with  . Fall    The history is provided by the patient. The history is limited by the condition of the patient. No language interpreter was used.    HPI Comments: Dominique Miller is a [age] y.o. female who presents to the Emergency Department complaining of fall just PTA. Per EMS, she fell out of bed in high position and fall was witnessed by staff at nursing. She has a hematoma above left eye and complains of headache. She is normally demented and confused.  Past Medical History  Diagnosis Date  . Dementia   . Hypothyroidism   . Anemia   . Diabetes mellitus without complication   . COPD (chronic obstructive pulmonary disease)    Past Surgical History  Procedure Laterality Date  . No past surgeries     Family History  Problem Relation Age of Onset  . Heart attack Mother   . Heart attack Father    History  Substance Use Topics  . Smoking status: Never Smoker   . Smokeless tobacco: Never Used  . Alcohol Use: No   OB History    No data available     Review of Systems  Unable to perform ROS: Dementia  Musculoskeletal: Negative for back pain.  Skin: Positive for wound.  Neurological: Positive for headaches.  Psychiatric/Behavioral: Positive for confusion (baseline).      Allergies  Review of patient's allergies indicates no known allergies.  Home Medications   Prior to Admission medications   Medication Sig Start Date End Date Taking? Authorizing Provider  ALPRAZolam Prudy Feeler(XANAX) 0.5 MG tablet Take one tablet by mouth twice daily for anxiety Patient taking differently: Take 0.5 mg by mouth 2 (two) times daily.  05/22/14  Yes Tiffany L Reed, DO  calcium-vitamin D (OSCAL WITH D)  500-200 MG-UNIT per tablet Take 1 tablet by mouth 2 (two) times daily.   Yes Historical Provider, MD  divalproex (DEPAKOTE SPRINKLE) 125 MG capsule Take 250 mg by mouth 2 (two) times daily.   Yes Historical Provider, MD  DULoxetine (CYMBALTA) 30 MG capsule Take 30 mg by mouth every other day.    Yes Historical Provider, MD  folic acid (FOLVITE) 800 MCG tablet Take 800 mcg by mouth daily.   Yes Historical Provider, MD  HYDROcodone-acetaminophen (NORCO/VICODIN) 5-325 MG per tablet Take 1 tablet by mouth every 8 (eight) hours as needed (pain). For 1 month then D/C(started 09/04/14)   Yes Historical Provider, MD  levothyroxine (SYNTHROID, LEVOTHROID) 125 MCG tablet Take 125 mcg by mouth daily.   Yes Historical Provider, MD  lisinopril (PRINIVIL,ZESTRIL) 2.5 MG tablet Take 2.5 mg by mouth daily.   Yes Historical Provider, MD  loratadine (CLARITIN) 10 MG tablet Take 10 mg by mouth daily.   Yes Historical Provider, MD  Melatonin 3 MG TABS Take 1 tablet by mouth at bedtime.   Yes Historical Provider, MD  memantine (NAMENDA XR) 28 MG CP24 24 hr capsule Take 28 mg by mouth daily.   Yes Historical Provider, MD  metoprolol tartrate (LOPRESSOR) 25 MG tablet Take 12.5 mg by mouth daily.   Yes Historical Provider, MD  omeprazole (PRILOSEC) 20 MG capsule Take 20  mg by mouth daily.   Yes Historical Provider, MD  vitamin B-12 (CYANOCOBALAMIN) 1000 MCG tablet Take 1,000 mcg by mouth daily.   Yes Historical Provider, MD  aluminum & magnesium hydroxide-simethicone (MYLANTA) 500-450-40 MG/5ML suspension Take 30 mLs by mouth every 6 (six) hours as needed for indigestion (do not exceed 4 doses in 24 hours).    Historical Provider, MD  Cholecalciferol (VITAMIN D3) 50000 UNITS CAPS Take 1 capsule by mouth every 30 (thirty) days.     Historical Provider, MD  promethazine (PHENERGAN) 25 MG tablet Take 25 mg by mouth every 6 (six) hours as needed for nausea or vomiting (Not to exceed 4 doses in 24 hours).     Historical Provider,  MD   BP 135/83 mmHg  Pulse 75  Temp(Src) 97.6 F (36.4 C) (Oral)  Resp 15  Ht 5\' 4"  (1.626 m)  Wt 160 lb (72.576 kg)  BMI 27.45 kg/m2  SpO2 98% Physical Exam  Constitutional: She is oriented to person, place, and time. She appears well-developed and well-nourished. No distress.  HENT:  Head: Normocephalic and atraumatic.  Right Ear: Hearing normal.  Left Ear: Hearing normal.  Nose: Nose normal.  Mouth/Throat: Oropharynx is clear and moist and mucous membranes are normal.  Eyes: Conjunctivae and EOM are normal. Pupils are equal, round, and reactive to light.  Neck: Normal range of motion. Neck supple.  Cardiovascular: Regular rhythm, S1 normal and S2 normal.  Exam reveals no gallop and no friction rub.   No murmur heard. Pulmonary/Chest: Effort normal and breath sounds normal. No respiratory distress. She exhibits no tenderness.  Abdominal: Soft. Normal appearance and bowel sounds are normal. There is no hepatosplenomegaly. There is no tenderness. There is no rebound, no guarding, no tenderness at McBurney's point and negative Murphy's sign. No hernia.  Musculoskeletal: Normal range of motion.  Large contusion with very small laceration in left eyebrow area  Neurological: She is alert and oriented to person, place, and time. She has normal strength. No cranial nerve deficit or sensory deficit. Coordination normal. GCS eye subscore is 4. GCS verbal subscore is 5. GCS motor subscore is 6.  Skin: Skin is warm, dry and intact. No rash noted. No cyanosis.  Psychiatric: She has a normal mood and affect. Her speech is normal and behavior is normal. Thought content normal.  Nursing note and vitals reviewed.   ED Course  Procedures (including critical care time)  DIAGNOSTIC STUDIES: Oxygen Saturation is 98% on room air, normal by my interpretation.    COORDINATION OF CARE: 9:39 AM Discussed treatment plan with patient at beside, the patient agrees with the plan and has no further  questions at this time.   Labs Review Labs Reviewed  CBC WITH DIFFERENTIAL/PLATELET - Abnormal; Notable for the following:    RBC 3.58 (*)    Hemoglobin 10.5 (*)    HCT 33.2 (*)    All other components within normal limits  BASIC METABOLIC PANEL - Abnormal; Notable for the following:    Sodium 133 (*)    Glucose, Bld 111 (*)    GFR calc non Af Amer 74 (*)    GFR calc Af Amer 86 (*)    Anion gap 3 (*)    All other components within normal limits  URINALYSIS, ROUTINE W REFLEX MICROSCOPIC  PROTIME-INR  TSH    Imaging Review Dg Chest 1 View  09/12/2014   CLINICAL DATA:  Pain following fall  EXAM: CHEST  1 VIEW  COMPARISON:  Dec 31, 2009  FINDINGS: There is extensive interstitial fibrosis, stable. There is no frank edema or consolidation. Heart is mildly enlarged with pulmonary vascularity within normal limits, stable. No pneumothorax. No acute fracture apparent. There is degenerative change in both shoulders. Multiple compression fractures are noted in the thoracic spine, stable.  IMPRESSION: Extensive interstitial fibrosis. No frank edema or consolidation. Stable cardiomegaly. No pneumothorax.   Electronically Signed   By: Bretta Bang III M.D.   On: 09/12/2014 10:34   Dg Thoracic Spine 2 View  09/12/2014   CLINICAL DATA:  Trauma secondary to a fall today. Head trauma. Headache. Altered mental status.  EXAM: THORACIC SPINE - 2 VIEW  COMPARISON:  Chest x-ray dated 12/15/2003 and CT scan of the abdomen and pelvis dated 04/15/2014 and rib x-rays dated 12/31/2009  FINDINGS: There is an old compression fracture of T7, unchanged since 04/15/2014. There is no acute fracture. Degenerative osteophytes in the mid to lower thoracic spine. No disc space narrowing or bone destruction.  Old healed right anterior rib fractures.  IMPRESSION: No acute abnormality of the thoracic spine. Old compression fracture of T7.   Electronically Signed   By: Francene Boyers M.D.   On: 09/12/2014 10:36   Dg Lumbar  Spine Complete  09/12/2014   CLINICAL DATA:  81 year old female who fell out of bed, hematoma. Initial encounter.  EXAM: LUMBAR SPINE - COMPLETE 4+ VIEW  COMPARISON:  CT Abdomen and Pelvis 04/15/2014.  FINDINGS: Osteopenia and widespread bulky degenerative endplate changes in the spine. Normal lumbar segmentation depicted on comparison. Overall lumbar vertebral height and alignment appears stable. Grossly intact visualized lower thoracic levels. Advanced Aortoiliac calcified atherosclerosis noted. On the lateral view the sacrum appears to remain grossly intact. Partially visible hip arthroplasty.  IMPRESSION: No acute fracture or listhesis identified in the lumbar spine.   Electronically Signed   By: Odessa Fleming M.D.   On: 09/12/2014 10:35   Dg Pelvis 1-2 Views  09/12/2014   CLINICAL DATA:  81 year old female who fell out of bed, hematoma. Initial encounter.  EXAM: PELVIS - 1-2 VIEW  COMPARISON:  CT Abdomen and Pelvis 04/15/2014 and earlier.  FINDINGS: Chronic right hip arthroplasty. Chronic osteopenia. Pubic rami and pubic symphysis appear stable. Iliac wings appear intact. Grossly stable sacrum. No acute pelvic fracture identified. Visible proximal femurs and femoral hardware appear grossly intact.  IMPRESSION: No acute fracture or dislocation identified about the pelvis. If there is lateralizing hip pain recommend dedicated hip series.   Electronically Signed   By: Odessa Fleming M.D.   On: 09/12/2014 10:36   Ct Head Wo Contrast  09/12/2014   CLINICAL DATA:  Fall from bed at nursing home. Hematoma over left eye. Headache.  EXAM: CT HEAD WITHOUT CONTRAST  CT CERVICAL SPINE WITHOUT CONTRAST  TECHNIQUE: Multidetector CT imaging of the head and cervical spine was performed following the standard protocol without intravenous contrast. Multiplanar CT image reconstructions of the cervical spine were also generated.  COMPARISON:  04/15/2014  FINDINGS: CT HEAD FINDINGS  Large left frontal scalp contusion/ hematoma. High-density  material noted along the falx, likely subdural hematoma. Somewhat but tumor shaped high-density blood noted over the left temporal region. This is presumably subdural hematoma given the lack of overlying skull fracture, but cannot completely exclude epidural hematoma. Slight mass-effect on the underlying left cerebral hemisphere. Approximately 4 mm of left-to-right midline shift. No hydrocephalus.  Chronic microvascular disease throughout the cerebral hemispheres. No acute calvarial abnormality.  CT CERVICAL SPINE FINDINGS  Study quality is  degraded by motion. Normal alignment. No definite fracture. Prevertebral soft tissues are normal.  IMPRESSION: Blood along the falx in the midline, likely subdural blood. 4 mm thickness extra-axial blood collection over the left cerebral hemisphere in the region of the low left temporal lobe. I suspect this represents a subdural hematoma as well although it is slightly lenticular shape raising the possibility of epidural hematoma. No underlying calvarial fracture.  4 mm of left-to-right midline shift.  Chronic microvascular disease.  Cervical spine is degraded by motion. No definite acute bony abnormality.  Critical Value/emergent results were called by telephone at the time of interpretation on 09/12/2014 at 10:40 am to Dr. Jaci Carrel , who verbally acknowledged these results.   Electronically Signed   By: Charlett Nose M.D.   On: 09/12/2014 10:39   Ct Cervical Spine Wo Contrast  09/12/2014   CLINICAL DATA:  Fall from bed at nursing home. Hematoma over left eye. Headache.  EXAM: CT HEAD WITHOUT CONTRAST  CT CERVICAL SPINE WITHOUT CONTRAST  TECHNIQUE: Multidetector CT imaging of the head and cervical spine was performed following the standard protocol without intravenous contrast. Multiplanar CT image reconstructions of the cervical spine were also generated.  COMPARISON:  04/15/2014  FINDINGS: CT HEAD FINDINGS  Large left frontal scalp contusion/ hematoma. High-density  material noted along the falx, likely subdural hematoma. Somewhat but tumor shaped high-density blood noted over the left temporal region. This is presumably subdural hematoma given the lack of overlying skull fracture, but cannot completely exclude epidural hematoma. Slight mass-effect on the underlying left cerebral hemisphere. Approximately 4 mm of left-to-right midline shift. No hydrocephalus.  Chronic microvascular disease throughout the cerebral hemispheres. No acute calvarial abnormality.  CT CERVICAL SPINE FINDINGS  Study quality is degraded by motion. Normal alignment. No definite fracture. Prevertebral soft tissues are normal.  IMPRESSION: Blood along the falx in the midline, likely subdural blood. 4 mm thickness extra-axial blood collection over the left cerebral hemisphere in the region of the low left temporal lobe. I suspect this represents a subdural hematoma as well although it is slightly lenticular shape raising the possibility of epidural hematoma. No underlying calvarial fracture.  4 mm of left-to-right midline shift.  Chronic microvascular disease.  Cervical spine is degraded by motion. No definite acute bony abnormality.  Critical Value/emergent results were called by telephone at the time of interpretation on 09/12/2014 at 10:40 am to Dr. Jaci Carrel , who verbally acknowledged these results.   Electronically Signed   By: Charlett Nose M.D.   On: 09/12/2014 10:39     EKG Interpretation   Date/Time:  Tuesday September 12 2014 11:16:02 EST Ventricular Rate:  76 PR Interval:  175 QRS Duration: 84 QT Interval:  419 QTC Calculation: 471 R Axis:   36 Text Interpretation:  Sinus rhythm Baseline wander in lead(s) I III aVL  Normal ECG Confirmed by POLLINA  MD, CHRISTOPHER [phone]) on 09/12/2014  11:22:39 AM      MDM   Final diagnoses:  Fall  SDH (subdural hematoma)    Patient presents to the ER for evaluation after a fall. Patient had a witnessed fall from bed. She did hit  the left side of her head on the ground but there was no loss of consciousness. Patient has a large hematoma over the left thigh. There is a superficial laceration as well. Patient is demented, cannot give me a complete history arrival. She was complaining of headache for EMS. At time of arrival she still saying that her  head hurts.  CT head and cervical spine were performed. No evidence of cervical spine injury, but she does have evidence of subdural hematoma. Case was discussed with Dr. Conchita Paris, on call for neurosurgery. He has reviewed the images and confirms that there is no neurosurgical intervention recommended. Recommendation at this time is hospitalization for observation for 24-48 hours, discharge home if no deterioration.   Patient does not have a living will or DO NOT RESUSCITATE. She has 11 children, but there is no power of attorney. At this time, family is unclear if it would even want surgery if recommended. Family will need to consider this further.  I personally performed the services described in this documentation, which was scribed in my presence. The recorded information has been reviewed and is accurate.    Gilda Crease, MD 09/12/14 [id number]

## 2014-09-12 NOTE — ED Notes (Signed)
EMS reports pt fell from bed at the nursing facility in high position.   EMS reports fall was witnessed, no LOC.  Hematoma over left eye.  Pt c/o headache.  Pt fully immobilized by EMS>

## 2014-09-13 ENCOUNTER — Inpatient Hospital Stay (HOSPITAL_COMMUNITY): Payer: Medicare Other

## 2014-09-13 DIAGNOSIS — S0003XD Contusion of scalp, subsequent encounter: Secondary | ICD-10-CM

## 2014-09-13 DIAGNOSIS — R339 Retention of urine, unspecified: Secondary | ICD-10-CM

## 2014-09-13 DIAGNOSIS — W19XXXD Unspecified fall, subsequent encounter: Secondary | ICD-10-CM

## 2014-09-13 LAB — GLUCOSE, CAPILLARY
GLUCOSE-CAPILLARY: 104 mg/dL — AB (ref 70–99)
Glucose-Capillary: 154 mg/dL — ABNORMAL HIGH (ref 70–99)
Glucose-Capillary: 91 mg/dL (ref 70–99)
Glucose-Capillary: 115 mg/dL — ABNORMAL HIGH (ref 70–99)

## 2014-09-13 MED ORDER — ALPRAZOLAM 0.5 MG PO TABS: 0.5000 mg | ORAL_TABLET | Freq: Two times a day (BID) | ORAL | Status: AC | PRN

## 2014-09-13 MED ORDER — HYDROCODONE-ACETAMINOPHEN 5-325 MG PO TABS: 1.0000 | ORAL_TABLET | Freq: Three times a day (TID) | ORAL | Status: AC | PRN

## 2014-09-13 NOTE — Care Management Note (Signed)
    Page 1 of 1   09/13/2014     12:57:09 PM CARE MANAGEMENT NOTE 09/13/2014  Patient:  Dominique Miller,Dominique Miller   Account Number:  [id number]  Date Initiated:  09/13/2014  Documentation initiated by:  Kathyrn SheriffHILDRESS,JESSICA  Subjective/Objective Assessment:   Pt is from [facility]. Pt admitted for subdural hematoma after falling at SNF. Pt's daughters at bedside. Pt's daughters say pt will discharge to [facility]. CSW made aware and will arrange to discharge to facility.     Action/Plan:   No CM needs identified. Probable discharge today.   Anticipated DC Date:  09/13/2014   Anticipated DC Plan:  SKILLED NURSING FACILITY  In-house referral  Clinical Social Worker      DC Planning Services  CM consult      Choice offered to / List presented to:             Status of service:  Completed, signed off Medicare Important Message given?   (If response is "NO", the following Medicare IM given date fields will be blank) Date Medicare IM given:   Medicare IM given by:   Date Additional Medicare IM given:   Additional Medicare IM given by:    Discharge Disposition:  SKILLED NURSING FACILITY  Per UR Regulation:  Reviewed for med. necessity/level of care/duration of stay  If discussed at Long Length of Stay Meetings, dates discussed:    Comments:  09/13/2014 1245 Kathyrn SheriffJessica Childress, RN, MSN, CM

## 2014-09-13 NOTE — Progress Notes (Signed)
[facility] nurse at [facility].  Verbalized understanding.  Pt to be transferred to facility when [facility] arrives. [place], Candelaria StagersLeigh Anne 09/13/2014

## 2014-09-13 NOTE — Clinical Social Work Psychosocial (Signed)
Clinical Social Work Department BRIEF PSYCHOSOCIAL ASSESSMENT 09/13/2014  Patient:  Dominique Miller, Dominique Miller     Account Number:  [id number]     Admit date:  09/12/2014  Clinical Social Worker:  Wyatt Haste  Date/Time:  09/13/2014 03:47 PM  Referred by:  CSW  Date Referred:  09/13/2014 Referred for  SNF Placement   Other Referral:   Interview type:  Family Other interview type:   daughters- Dominique Miller    PSYCHOSOCIAL DATA Living Status:  FACILITY Admitted from facility:  [facility] Level of care:  [place] Primary support name:  Dominique Miller Primary support relationship to patient:  CHILD, ADULT Degree of support available:   supportive    CURRENT CONCERNS Current Concerns  Post-Acute Placement   Other Concerns:    SOCIAL WORK ASSESSMENT / PLAN CSW met with pt's daughter, Dominique Miller, and Dominique Miller at bedside. Pt sleeping during assessment. They report that pt has 11 children, but no HCPOA. These 3 daughters are most involved at this time. They state pt has been a resident at [facility] on memory care unit for the past 2-3 years. She had been at [facility] prior to that for several years as well. Pt was wandering too much and became a safety risk, so they had to move her to [facility]. Daughters report they have been upset due to 3 falls in the last several months at [facility].  Pt is not mobile any longer per family and they have discussed with [facility] already about returning to them. CSW spoke with [name] at [place] who confirms this plan. Daughters are very relieved that pt will be able to be at [facility] again. They plan to notify [name] themselves today. Pt d/c today. Daughters and facility aware. D/C summary faxed. Family arranging transport via [name].   Assessment/plan status:  No Further Intervention Required Other assessment/ plan:   Information/referral to community resources:    PATIENT'S/FAMILY'S  RESPONSE TO PLAN OF CARE: Pt's family relieved that pt will be able to return to [facility] today.       [name], [name]

## 2014-09-13 NOTE — Discharge Summary (Signed)
Physician Discharge Summary  Dominique Miller ZOX:[id number] DOB: 14-Jun-1919 DOA: 09/12/2014  PCP: PROVIDER NOT IN SYSTEM  Admit date: 09/12/2014 Discharge date: 09/13/2014  Time spent: 40 minutes  Recommendations for Outpatient Follow-up:  1. Patient will be discharged to [facility] nursing facility for further care. 2. Continue to monitor for signs of worsening lethargy/confusion. If patient's mental status worsens, she will need reevaluation in the emergency room. 3. Repeat thyroid function studies in 2 weeks since her TSH was noted to be elevated at 7.4. 4. Patient had Foley catheter placed for urinary retention. Remove catheter after 4-5 days to conduct voiding trial. May need urology follow-up if fails voiding trial  Discharge Diagnoses:  Principal Problem:   SDH (subdural hematoma) Active Problems:   Alzheimer's disease   Fall   Scalp hematoma  essential hypertension Hypothyroidism History of pulmonary fibrosis Urinary retention  Discharge Condition: Stable  Diet recommendation: Low-salt  Filed Weights   09/12/14 0929 09/12/14 1446 09/13/14 0500  Weight: 72.576 kg (160 lb) 69.8 kg (153 lb 14.1 oz) 70.1 kg (154 lb 8.7 oz)    History of present illness and hospital course:  This is a 81 year old female with advanced dementia who is essentially wheelchair-bound and resident of a nursing facility, was brought to the emergency room after suffering a fall. She was found to have a scalp hematoma. Per EMS reports, she fell out of her bed which was found to be in a high position. She had a witnessed fall by nursing staff. There was no loss of consciousness or seizures. She was evaluated in the emergency room where CT scan of the head showed a underlying subdural hematoma. Case was discussed with neurosurgery, Dr. Conchita Paris and it was felt that the patient did not require any surgical intervention at this time. Recommendations were to admit the patient for observation and repeat CT in the  morning. Patient was monitored for over 24 hours with stability her overall clinical condition. She is awake, sitting up in bed, is confused (at baseline) and does not have any new complaints. CT scan of the head was repeated today which shows stability and underlying subdural hematoma. She can be further monitored at the skilled nursing facility. Patient did develop some urinary retention during her hospital stay. Foley catheter was placed at this time and she may undergo voiding trial after 4-5 days. If the patient fails voiding trial, she may need outpatient urology workup.  Procedures:    Consultations:    Discharge Exam: Filed Vitals:   09/13/14 0900  BP: 110/48  Pulse: 61  Temp:   Resp: 13    General: No acute distress, sitting up in bed, bruising/hematoma is noted on the left side of her face and forehead. Cardiovascular: S1, S2, regular rate and rhythm Respiratory: Clear to auscultation bilaterally  Discharge Instructions   Discharge Instructions    Diet - low sodium heart healthy    Complete by:  As directed      Increase activity slowly    Complete by:  As directed           Current Discharge Medication List    CONTINUE these medications which have CHANGED   Details  ALPRAZolam (XANAX) 0.5 MG tablet Take 1 tablet (0.5 mg total) by mouth 2 (two) times daily as needed for anxiety. Qty: 60 tablet, Refills: 5    HYDROcodone-acetaminophen (NORCO/VICODIN) 5-325 MG per tablet Take 1 tablet by mouth every 8 (eight) hours as needed (pain). For 1 month then D/C(started 09/04/14)  Qty: 30 tablet, Refills: 0      CONTINUE these medications which have NOT CHANGED   Details  calcium-vitamin D (OSCAL WITH D) 500-200 MG-UNIT per tablet Take 1 tablet by mouth 2 (two) times daily.    divalproex (DEPAKOTE SPRINKLE) 125 MG capsule Take 250 mg by mouth 2 (two) times daily.    DULoxetine (CYMBALTA) 30 MG capsule Take 30 mg by mouth every other day.     folic acid (FOLVITE) 800  MCG tablet Take 800 mcg by mouth daily.    levothyroxine (SYNTHROID, LEVOTHROID) 125 MCG tablet Take 125 mcg by mouth daily.    lisinopril (PRINIVIL,ZESTRIL) 2.5 MG tablet Take 2.5 mg by mouth daily.    loratadine (CLARITIN) 10 MG tablet Take 10 mg by mouth daily.    Melatonin 3 MG TABS Take 1 tablet by mouth at bedtime.    memantine (NAMENDA XR) 28 MG CP24 24 hr capsule Take 28 mg by mouth daily.    metoprolol tartrate (LOPRESSOR) 25 MG tablet Take 12.5 mg by mouth daily.    omeprazole (PRILOSEC) 20 MG capsule Take 20 mg by mouth daily.    vitamin B-12 (CYANOCOBALAMIN) 1000 MCG tablet Take 1,000 mcg by mouth daily.    aluminum & magnesium hydroxide-simethicone (MYLANTA) 500-450-40 MG/5ML suspension Take 30 mLs by mouth every 6 (six) hours as needed for indigestion (do not exceed 4 doses in 24 hours).    Cholecalciferol (VITAMIN D3) 50000 UNITS CAPS Take 1 capsule by mouth every 30 (thirty) days.     promethazine (PHENERGAN) 25 MG tablet Take 25 mg by mouth every 6 (six) hours as needed for nausea or vomiting (Not to exceed 4 doses in 24 hours).        No Known Allergies    The results of significant diagnostics from this hospitalization (including imaging, microbiology, ancillary and laboratory) are listed below for reference.    Significant Diagnostic Studies: Dg Chest 1 View  09/12/2014   CLINICAL DATA:  Pain following fall  EXAM: CHEST  1 VIEW  COMPARISON:  Dec 31, 2009  FINDINGS: There is extensive interstitial fibrosis, stable. There is no frank edema or consolidation. Heart is mildly enlarged with pulmonary vascularity within normal limits, stable. No pneumothorax. No acute fracture apparent. There is degenerative change in both shoulders. Multiple compression fractures are noted in the thoracic spine, stable.  IMPRESSION: Extensive interstitial fibrosis. No frank edema or consolidation. Stable cardiomegaly. No pneumothorax.   Electronically Signed   By: Bretta Bang III  M.D.   On: 09/12/2014 10:34   Dg Thoracic Spine 2 View  09/12/2014   CLINICAL DATA:  Trauma secondary to a fall today. Head trauma. Headache. Altered mental status.  EXAM: THORACIC SPINE - 2 VIEW  COMPARISON:  Chest x-ray dated 12/15/2003 and CT scan of the abdomen and pelvis dated 04/15/2014 and rib x-rays dated 12/31/2009  FINDINGS: There is an old compression fracture of T7, unchanged since 04/15/2014. There is no acute fracture. Degenerative osteophytes in the mid to lower thoracic spine. No disc space narrowing or bone destruction.  Old healed right anterior rib fractures.  IMPRESSION: No acute abnormality of the thoracic spine. Old compression fracture of T7.   Electronically Signed   By: Francene Boyers M.D.   On: 09/12/2014 10:36   Dg Lumbar Spine Complete  09/12/2014   CLINICAL DATA:  81 year old female who fell out of bed, hematoma. Initial encounter.  EXAM: LUMBAR SPINE - COMPLETE 4+ VIEW  COMPARISON:  CT Abdomen and Pelvis  04/15/2014.  FINDINGS: Osteopenia and widespread bulky degenerative endplate changes in the spine. Normal lumbar segmentation depicted on comparison. Overall lumbar vertebral height and alignment appears stable. Grossly intact visualized lower thoracic levels. Advanced Aortoiliac calcified atherosclerosis noted. On the lateral view the sacrum appears to remain grossly intact. Partially visible hip arthroplasty.  IMPRESSION: No acute fracture or listhesis identified in the lumbar spine.   Electronically Signed   By: Odessa Fleming M.D.   On: 09/12/2014 10:35   Dg Pelvis 1-2 Views  09/12/2014   CLINICAL DATA:  81 year old female who fell out of bed, hematoma. Initial encounter.  EXAM: PELVIS - 1-2 VIEW  COMPARISON:  CT Abdomen and Pelvis 04/15/2014 and earlier.  FINDINGS: Chronic right hip arthroplasty. Chronic osteopenia. Pubic rami and pubic symphysis appear stable. Iliac wings appear intact. Grossly stable sacrum. No acute pelvic fracture identified. Visible proximal femurs and femoral  hardware appear grossly intact.  IMPRESSION: No acute fracture or dislocation identified about the pelvis. If there is lateralizing hip pain recommend dedicated hip series.   Electronically Signed   By: Odessa Fleming M.D.   On: 09/12/2014 10:36   Ct Head Wo Contrast  09/13/2014   CLINICAL DATA:  Subsequent evaluation for subdural hematoma  EXAM: CT HEAD WITHOUT CONTRAST  TECHNIQUE: Contiguous axial images were obtained from the base of the skull through the vertex without intravenous contrast.  COMPARISON:  09/12/2014  FINDINGS: There is an anterior scalp hematoma in the frontal regions of left of midline similar to prior study. There is a midline subdural hematoma superimposed on pre-existing calcification of the falx. This measures 5 mm and is unchanged. Left lateral hyper attenuating extra-axial fluid over the temporal lobe is stable at about 5 mm. No significant midline shift. No parenchymal hemorrhage.  Stable diffuse atrophy. No evidence of vascular territory infarct. No evidence of mass.  Calvarium intact. Atherosclerotic calcifications of the carotid arteries again noted.  IMPRESSION: Stable extra-axial fluid collections   Electronically Signed   By: Esperanza Heir M.D.   On: 09/13/2014 11:53   Ct Head Wo Contrast  09/12/2014   CLINICAL DATA:  Fall from bed at nursing home. Hematoma over left eye. Headache.  EXAM: CT HEAD WITHOUT CONTRAST  CT CERVICAL SPINE WITHOUT CONTRAST  TECHNIQUE: Multidetector CT imaging of the head and cervical spine was performed following the standard protocol without intravenous contrast. Multiplanar CT image reconstructions of the cervical spine were also generated.  COMPARISON:  04/15/2014  FINDINGS: CT HEAD FINDINGS  Large left frontal scalp contusion/ hematoma. High-density material noted along the falx, likely subdural hematoma. Somewhat but tumor shaped high-density blood noted over the left temporal region. This is presumably subdural hematoma given the lack of overlying  skull fracture, but cannot completely exclude epidural hematoma. Slight mass-effect on the underlying left cerebral hemisphere. Approximately 4 mm of left-to-right midline shift. No hydrocephalus.  Chronic microvascular disease throughout the cerebral hemispheres. No acute calvarial abnormality.  CT CERVICAL SPINE FINDINGS  Study quality is degraded by motion. Normal alignment. No definite fracture. Prevertebral soft tissues are normal.  IMPRESSION: Blood along the falx in the midline, likely subdural blood. 4 mm thickness extra-axial blood collection over the left cerebral hemisphere in the region of the low left temporal lobe. I suspect this represents a subdural hematoma as well although it is slightly lenticular shape raising the possibility of epidural hematoma. No underlying calvarial fracture.  4 mm of left-to-right midline shift.  Chronic microvascular disease.  Cervical spine is degraded by motion.  No definite acute bony abnormality.  Critical Value/emergent results were called by telephone at the time of interpretation on 09/12/2014 at 10:40 am to Dr. Jaci Carrel , who verbally acknowledged these results.   Electronically Signed   By: Charlett Nose M.D.   On: 09/12/2014 10:39   Ct Cervical Spine Wo Contrast  09/12/2014   CLINICAL DATA:  Fall from bed at nursing home. Hematoma over left eye. Headache.  EXAM: CT HEAD WITHOUT CONTRAST  CT CERVICAL SPINE WITHOUT CONTRAST  TECHNIQUE: Multidetector CT imaging of the head and cervical spine was performed following the standard protocol without intravenous contrast. Multiplanar CT image reconstructions of the cervical spine were also generated.  COMPARISON:  04/15/2014  FINDINGS: CT HEAD FINDINGS  Large left frontal scalp contusion/ hematoma. High-density material noted along the falx, likely subdural hematoma. Somewhat but tumor shaped high-density blood noted over the left temporal region. This is presumably subdural hematoma given the lack of overlying  skull fracture, but cannot completely exclude epidural hematoma. Slight mass-effect on the underlying left cerebral hemisphere. Approximately 4 mm of left-to-right midline shift. No hydrocephalus.  Chronic microvascular disease throughout the cerebral hemispheres. No acute calvarial abnormality.  CT CERVICAL SPINE FINDINGS  Study quality is degraded by motion. Normal alignment. No definite fracture. Prevertebral soft tissues are normal.  IMPRESSION: Blood along the falx in the midline, likely subdural blood. 4 mm thickness extra-axial blood collection over the left cerebral hemisphere in the region of the low left temporal lobe. I suspect this represents a subdural hematoma as well although it is slightly lenticular shape raising the possibility of epidural hematoma. No underlying calvarial fracture.  4 mm of left-to-right midline shift.  Chronic microvascular disease.  Cervical spine is degraded by motion. No definite acute bony abnormality.  Critical Value/emergent results were called by telephone at the time of interpretation on 09/12/2014 at 10:40 am to Dr. Jaci Carrel , who verbally acknowledged these results.   Electronically Signed   By: Charlett Nose M.D.   On: 09/12/2014 10:39    Microbiology: Recent Results (from the past 240 hour(s))  MRSA PCR Screening     Status: Abnormal   Collection Time: 09/12/14  2:24 PM  Result Value Ref Range Status   MRSA by PCR POSITIVE (A) NEGATIVE Final    Comment:        The GeneXpert MRSA Assay (FDA approved for NASAL specimens only), is one component of a comprehensive MRSA colonization surveillance program. It is not intended to diagnose MRSA infection nor to guide or monitor treatment for MRSA infections. RESULT CALLED TO, READ BACK BY AND VERIFIED WITH: STONE,A. AT 1739 ON 09/12/2014 BY BAUGHAM,M.      Labs: Basic Metabolic Panel:  Recent Labs Lab 09/12/14 1046  NA 133*  K 4.0  CL 102  CO2 28  GLUCOSE 111*  BUN 20  CREATININE 0.66   CALCIUM 8.6   Liver Function Tests: No results for input(s): AST, ALT, ALKPHOS, BILITOT, PROT, ALBUMIN in the last 168 hours. No results for input(s): LIPASE, AMYLASE in the last 168 hours. No results for input(s): AMMONIA in the last 168 hours. CBC:  Recent Labs Lab 09/12/14 1046  WBC 7.1  NEUTROABS 4.6  HGB 10.5*  HCT 33.2*  MCV 92.7  PLT 255   Cardiac Enzymes: No results for input(s): CKTOTAL, CKMB, CKMBINDEX, TROPONINI in the last 168 hours. BNP: BNP (last 3 results) No results for input(s): BNP in the last 8760 hours.  ProBNP (last 3 results) No  results for input(s): PROBNP in the last 8760 hours.  CBG: No results for input(s): GLUCAP in the last 168 hours.     Signed:  Tuff Clabo  [facility] 09/13/2014, 2:25 PM

## 2014-09-14 NOTE — Care Management Utilization Note (Signed)
UR completed 

## 2014-10-04 ENCOUNTER — Encounter (HOSPITAL_COMMUNITY): Payer: Self-pay | Admitting: *Deleted

## 2014-10-04 ENCOUNTER — Inpatient Hospital Stay (HOSPITAL_COMMUNITY): Payer: Medicare Other

## 2014-10-04 ENCOUNTER — Inpatient Hospital Stay (HOSPITAL_COMMUNITY)
Admission: EM | Admit: 2014-10-04 | Discharge: 2014-11-03 | DRG: 871 | Disposition: E | Payer: Medicare Other | Attending: Internal Medicine | Admitting: Internal Medicine

## 2014-10-04 ENCOUNTER — Emergency Department (HOSPITAL_COMMUNITY): Payer: Medicare Other

## 2014-10-04 DIAGNOSIS — I4891 Unspecified atrial fibrillation: Secondary | ICD-10-CM | POA: Diagnosis present

## 2014-10-04 DIAGNOSIS — J449 Chronic obstructive pulmonary disease, unspecified: Secondary | ICD-10-CM | POA: Diagnosis present

## 2014-10-04 DIAGNOSIS — S065X9A Traumatic subdural hemorrhage with loss of consciousness of unspecified duration, initial encounter: Secondary | ICD-10-CM | POA: Diagnosis present

## 2014-10-04 DIAGNOSIS — R4182 Altered mental status, unspecified: Secondary | ICD-10-CM | POA: Diagnosis not present

## 2014-10-04 DIAGNOSIS — Z8249 Family history of ischemic heart disease and other diseases of the circulatory system: Secondary | ICD-10-CM | POA: Diagnosis not present

## 2014-10-04 DIAGNOSIS — E872 Acidosis, unspecified: Secondary | ICD-10-CM | POA: Diagnosis present

## 2014-10-04 DIAGNOSIS — G9340 Encephalopathy, unspecified: Secondary | ICD-10-CM | POA: Diagnosis present

## 2014-10-04 DIAGNOSIS — A419 Sepsis, unspecified organism: Secondary | ICD-10-CM | POA: Diagnosis not present

## 2014-10-04 DIAGNOSIS — E119 Type 2 diabetes mellitus without complications: Secondary | ICD-10-CM

## 2014-10-04 DIAGNOSIS — R5381 Other malaise: Secondary | ICD-10-CM | POA: Diagnosis present

## 2014-10-04 DIAGNOSIS — Z66 Do not resuscitate: Secondary | ICD-10-CM | POA: Diagnosis present

## 2014-10-04 DIAGNOSIS — N390 Urinary tract infection, site not specified: Secondary | ICD-10-CM | POA: Diagnosis present

## 2014-10-04 DIAGNOSIS — S065X0S Traumatic subdural hemorrhage without loss of consciousness, sequela: Secondary | ICD-10-CM | POA: Diagnosis not present

## 2014-10-04 DIAGNOSIS — F0281 Dementia in other diseases classified elsewhere with behavioral disturbance: Secondary | ICD-10-CM | POA: Diagnosis present

## 2014-10-04 DIAGNOSIS — R112 Nausea with vomiting, unspecified: Secondary | ICD-10-CM | POA: Diagnosis present

## 2014-10-04 DIAGNOSIS — J8410 Pulmonary fibrosis, unspecified: Secondary | ICD-10-CM | POA: Diagnosis present

## 2014-10-04 DIAGNOSIS — Z87891 Personal history of nicotine dependence: Secondary | ICD-10-CM

## 2014-10-04 DIAGNOSIS — I469 Cardiac arrest, cause unspecified: Secondary | ICD-10-CM | POA: Diagnosis not present

## 2014-10-04 DIAGNOSIS — R10819 Abdominal tenderness, unspecified site: Secondary | ICD-10-CM | POA: Diagnosis present

## 2014-10-04 DIAGNOSIS — E861 Hypovolemia: Secondary | ICD-10-CM | POA: Diagnosis present

## 2014-10-04 DIAGNOSIS — E039 Hypothyroidism, unspecified: Secondary | ICD-10-CM | POA: Diagnosis present

## 2014-10-04 DIAGNOSIS — E860 Dehydration: Secondary | ICD-10-CM | POA: Diagnosis present

## 2014-10-04 DIAGNOSIS — R400 Somnolence: Secondary | ICD-10-CM

## 2014-10-04 DIAGNOSIS — I6200 Nontraumatic subdural hemorrhage, unspecified: Secondary | ICD-10-CM

## 2014-10-04 DIAGNOSIS — K562 Volvulus: Secondary | ICD-10-CM | POA: Diagnosis present

## 2014-10-04 DIAGNOSIS — G309 Alzheimer's disease, unspecified: Secondary | ICD-10-CM | POA: Diagnosis present

## 2014-10-04 DIAGNOSIS — S0003XA Contusion of scalp, initial encounter: Secondary | ICD-10-CM | POA: Diagnosis present

## 2014-10-04 DIAGNOSIS — N179 Acute kidney failure, unspecified: Secondary | ICD-10-CM | POA: Diagnosis present

## 2014-10-04 DIAGNOSIS — R509 Fever, unspecified: Secondary | ICD-10-CM

## 2014-10-04 DIAGNOSIS — R7989 Other specified abnormal findings of blood chemistry: Secondary | ICD-10-CM | POA: Diagnosis present

## 2014-10-04 DIAGNOSIS — S065XAA Traumatic subdural hemorrhage with loss of consciousness status unknown, initial encounter: Secondary | ICD-10-CM | POA: Diagnosis present

## 2014-10-04 DIAGNOSIS — F0280 Dementia in other diseases classified elsewhere without behavioral disturbance: Secondary | ICD-10-CM | POA: Diagnosis present

## 2014-10-04 HISTORY — DX: Other specified abnormal findings of blood chemistry: R79.89

## 2014-10-04 HISTORY — DX: Traumatic subdural hemorrhage with loss of consciousness of unspecified duration, initial encounter: S06.5X9A

## 2014-10-04 HISTORY — DX: Contusion of scalp, initial encounter: S00.03XA

## 2014-10-04 HISTORY — DX: Pulmonary fibrosis, unspecified: J84.10

## 2014-10-04 LAB — HEPATIC FUNCTION PANEL
ALBUMIN: 2.5 g/dL — AB (ref 3.5–5.2)
ALK PHOS: 114 U/L (ref 39–117)
ALT: 32 U/L (ref 0–35)
AST: 69 U/L — AB (ref 0–37)
Bilirubin, Direct: 0.1 mg/dL (ref 0.0–0.5)
Indirect Bilirubin: 0.5 mg/dL (ref 0.3–0.9)
TOTAL PROTEIN: 7 g/dL (ref 6.0–8.3)
Total Bilirubin: 0.6 mg/dL (ref 0.3–1.2)

## 2014-10-04 LAB — URINALYSIS, ROUTINE W REFLEX MICROSCOPIC
Glucose, UA: 100 mg/dL — AB
Ketones, ur: 15 mg/dL — AB
Nitrite: POSITIVE — AB
PH: 6.5 (ref 5.0–8.0)
Protein, ur: >300 mg/dL — AB
SPECIFIC GRAVITY, URINE: 1.025 (ref 1.005–1.030)
Urobilinogen, UA: 4 mg/dL — ABNORMAL HIGH (ref 0.0–1.0)

## 2014-10-04 LAB — CBC WITH DIFFERENTIAL/PLATELET
BASOS ABS: 0 10*3/uL (ref 0.0–0.1)
Basophils Relative: 0 % (ref 0–1)
Eosinophils Absolute: 0 10*3/uL (ref 0.0–0.7)
Eosinophils Relative: 0 % (ref 0–5)
HCT: 46.2 % — ABNORMAL HIGH (ref 36.0–46.0)
Hemoglobin: 14.7 g/dL (ref 12.0–15.0)
LYMPHS PCT: 10 % — AB (ref 12–46)
Lymphs Abs: 1.7 10*3/uL (ref 0.7–4.0)
MCH: 30.1 pg (ref 26.0–34.0)
MCHC: 31.8 g/dL (ref 30.0–36.0)
MCV: 94.7 fL (ref 78.0–100.0)
MONO ABS: 0.7 10*3/uL (ref 0.1–1.0)
Monocytes Relative: 4 % (ref 3–12)
Neutro Abs: 14.2 10*3/uL — ABNORMAL HIGH (ref 1.7–7.7)
Neutrophils Relative %: 86 % — ABNORMAL HIGH (ref 43–77)
Platelets: 317 10*3/uL (ref 150–400)
RBC: 4.88 MIL/uL (ref 3.87–5.11)
RDW: 15.7 % — ABNORMAL HIGH (ref 11.5–15.5)
WBC: 16.7 10*3/uL — AB (ref 4.0–10.5)

## 2014-10-04 LAB — BASIC METABOLIC PANEL
Anion gap: 16 — ABNORMAL HIGH (ref 5–15)
BUN: 45 mg/dL — ABNORMAL HIGH (ref 6–23)
CHLORIDE: 106 mmol/L (ref 96–112)
CO2: 17 mmol/L — ABNORMAL LOW (ref 19–32)
CREATININE: 2.02 mg/dL — AB (ref 0.50–1.10)
Calcium: 9 mg/dL (ref 8.4–10.5)
GFR calc Af Amer: 23 mL/min — ABNORMAL LOW (ref >90)
GFR calc non Af Amer: 20 mL/min — ABNORMAL LOW (ref >90)
Glucose, Bld: 159 mg/dL — ABNORMAL HIGH (ref 70–99)
POTASSIUM: 5 mmol/L (ref 3.5–5.1)
SODIUM: 139 mmol/L (ref 135–145)

## 2014-10-04 LAB — URINE MICROSCOPIC-ADD ON

## 2014-10-04 LAB — LIPASE, BLOOD: Lipase: 20 U/L (ref 11–59)

## 2014-10-04 LAB — VALPROIC ACID LEVEL: Valproic Acid Lvl: <10 ug/mL — ABNORMAL LOW (ref 50.0–100.0)

## 2014-10-04 MED ORDER — METOPROLOL TARTRATE 1 MG/ML IV SOLN: 2.5000 mg | INTRAVENOUS | Status: DC | PRN

## 2014-10-04 MED ORDER — MEMANTINE HCL ER 28 MG PO CP24
28.0000 mg | ORAL_CAPSULE | Freq: Every day | ORAL | Status: DC
  Filled 2014-10-04 (×2): qty 1

## 2014-10-04 MED ORDER — DULOXETINE HCL 30 MG PO CPEP: 30.0000 mg | ORAL_CAPSULE | ORAL | Status: DC

## 2014-10-04 MED ORDER — ACETAMINOPHEN 325 MG PO TABS: 650.0000 mg | ORAL_TABLET | Freq: Four times a day (QID) | ORAL | Status: DC | PRN

## 2014-10-04 MED ORDER — ONDANSETRON HCL 4 MG/2ML IJ SOLN: 4.0000 mg | Freq: Four times a day (QID) | INTRAMUSCULAR | Status: DC | PRN

## 2014-10-04 MED ORDER — ALBUTEROL SULFATE (2.5 MG/3ML) 0.083% IN NEBU: 2.5000 mg | INHALATION_SOLUTION | RESPIRATORY_TRACT | Status: DC | PRN

## 2014-10-04 MED ORDER — METOPROLOL TARTRATE 25 MG PO TABS: 12.5000 mg | ORAL_TABLET | Freq: Every day | ORAL | Status: DC

## 2014-10-04 MED ORDER — ONDANSETRON HCL 4 MG PO TABS: 4.0000 mg | ORAL_TABLET | Freq: Four times a day (QID) | ORAL | Status: DC | PRN

## 2014-10-04 MED ORDER — LEVOTHYROXINE SODIUM 25 MCG PO TABS: 125.0000 ug | ORAL_TABLET | Freq: Every day | ORAL | Status: DC

## 2014-10-04 MED ORDER — ACETAMINOPHEN 650 MG RE SUPP
650.0000 mg | Freq: Once | RECTAL | Status: AC
  Administered 2014-10-04: 650 mg via RECTAL
  Filled 2014-10-04: qty 1

## 2014-10-04 MED ORDER — CEFEPIME HCL 2 G IJ SOLR
2.0000 g | Freq: Once | INTRAMUSCULAR | Status: AC
  Administered 2014-10-04: 2 g via INTRAVENOUS
  Filled 2014-10-04: qty 2

## 2014-10-04 MED ORDER — SODIUM CHLORIDE 0.45 % IV SOLN
INTRAVENOUS | Status: DC
  Administered 2014-10-04: 12:00:00 via INTRAVENOUS
  Filled 2014-10-04 (×3): qty 1000

## 2014-10-04 MED ORDER — ACETAMINOPHEN 650 MG RE SUPP: 650.0000 mg | Freq: Four times a day (QID) | RECTAL | Status: DC | PRN

## 2014-10-04 MED ORDER — MELATONIN 3 MG PO TABS: 1.0000 | ORAL_TABLET | Freq: Every day | ORAL | Status: DC

## 2014-10-04 MED ORDER — CEFEPIME HCL 1 G IJ SOLR
1.0000 g | INTRAMUSCULAR | Status: DC
  Filled 2014-10-04: qty 1

## 2014-10-04 MED ORDER — LISINOPRIL 5 MG PO TABS: 2.5000 mg | ORAL_TABLET | Freq: Every day | ORAL | Status: DC

## 2014-10-04 MED ORDER — FOLIC ACID 1 MG PO TABS: 1.0000 mg | ORAL_TABLET | Freq: Every day | ORAL | Status: DC

## 2014-10-04 MED ORDER — SODIUM CHLORIDE 0.9 % IV BOLUS (SEPSIS)
1000.0000 mL | Freq: Once | INTRAVENOUS | Status: AC
  Administered 2014-10-04: 1000 mL via INTRAVENOUS

## 2014-10-04 MED ORDER — DIVALPROEX SODIUM 125 MG PO CPSP
250.0000 mg | ORAL_CAPSULE | Freq: Two times a day (BID) | ORAL | Status: DC
  Filled 2014-10-04 (×3): qty 2

## 2014-10-04 MED ORDER — ALPRAZOLAM 0.5 MG PO TABS: 0.5000 mg | ORAL_TABLET | Freq: Two times a day (BID) | ORAL | Status: DC | PRN

## 2014-10-04 MED ORDER — LORATADINE 10 MG PO TABS: 10.0000 mg | ORAL_TABLET | Freq: Every day | ORAL | Status: DC

## 2014-10-04 MED ORDER — PANTOPRAZOLE SODIUM 40 MG IV SOLR
40.0000 mg | INTRAVENOUS | Status: DC
  Administered 2014-10-04: 40 mg via INTRAVENOUS
  Filled 2014-10-04: qty 40

## 2014-10-05 LAB — URINE CULTURE
Colony Count: NO GROWTH
Culture: NO GROWTH

## 2014-10-09 LAB — CULTURE, BLOOD (ROUTINE X 2)
CULTURE: NO GROWTH
CULTURE: NO GROWTH

## 2014-11-03 NOTE — Plan of Care (Signed)
Problem: Consults Goal: General Medical Patient Education See Patient Education Module for specific education. Outcome: Completed/Met Date Met:  16-Oct-2014 Family present

## 2014-11-03 NOTE — Progress Notes (Signed)
PHARMACIST - PHYSICIAN ORDER COMMUNICATION  CONCERNING: P&T Medication Policy on Herbal Medications  DESCRIPTION:  This patient's order for:  Melatonin  has been noted.  This product(s) is classified as an "herbal" or natural product. Due to a lack of definitive safety studies or FDA approval, nonstandard manufacturing practices, plus the potential risk of unknown drug-drug interactions while on inpatient medications, the Pharmacy and Therapeutics Committee does not permit the use of "herbal" or natural products of this type within [facility].   ACTION TAKEN: The pharmacy department is unable to verify this order at this time and your patient has been informed of this safety policy. Please reevaluate patient's clinical condition at discharge and address if the herbal or natural product(s) should be resumed at that time.   Valrie HartScott Chalet Kerwin, PharmD

## 2014-11-03 NOTE — ED Notes (Signed)
Third bolus not infusing to gravity. Third bolus scanned and placed on to pump.

## 2014-11-03 NOTE — Progress Notes (Signed)
ANTIBIOTIC CONSULT NOTE - INITIAL  Pharmacy Consult for Cefepime Indication: urosepsis  No Known Allergies  Patient Measurements: Height: 5\' 4"  (162.6 cm) Weight: 135 lb (61.236 kg) IBW/kg (Calculated) : 54.7  Vital Signs: Temp: 97.6 F (36.4 C) (03/02 0816) Temp Source: Axillary (03/02 0816) BP: 100/80 mmHg (03/02 0822) Pulse Rate: 100 (03/02 0816) Intake/Output from previous day:   Intake/Output from this shift:    Labs:  Recent Labs  09-03-2014 0439  WBC 16.7*  HGB 14.7  PLT 317  CREATININE 2.02*   Estimated Creatinine Clearance: 15 mL/min (by C-G formula based on Cr of 2.02). No results for input(s): VANCOTROUGH, VANCOPEAK, VANCORANDOM, GENTTROUGH, GENTPEAK, GENTRANDOM, TOBRATROUGH, TOBRAPEAK, TOBRARND, AMIKACINPEAK, AMIKACINTROU, AMIKACIN in the last 72 hours.   Microbiology: Recent Results (from the past 720 hour(s))  MRSA PCR Screening     Status: Abnormal   Collection Time: 09/12/14  2:24 PM  Result Value Ref Range Status   MRSA by PCR POSITIVE (A) NEGATIVE Final    Comment:        The GeneXpert MRSA Assay (FDA approved for NASAL specimens only), is one component of a comprehensive MRSA colonization surveillance program. It is not intended to diagnose MRSA infection nor to guide or monitor treatment for MRSA infections. RESULT CALLED TO, READ BACK BY AND VERIFIED WITH: STONE,A. AT 1739 ON 09/12/2014 BY BAUGHAM,M.     Medical History: Past Medical History  Diagnosis Date  . Dementia   . Hypothyroidism   . Anemia   . Diabetes mellitus without complication   . COPD (chronic obstructive pulmonary disease)    Anti-infectives    Start     Dose/Rate Route Frequency Ordered Stop   10/05/14 0600  ceFEPIme (MAXIPIME) 1 g in dextrose 5 % 50 mL IVPB     1 g 100 mL/hr over 30 Minutes Intravenous Every 24 hours 09-03-2014 0745     09-03-2014 0630  ceFEPIme (MAXIPIME) 2 g in dextrose 5 % 50 mL IVPB     2 g 100 mL/hr over 30 Minutes Intravenous  Once  09-03-2014 0625 09-03-2014 0719      Assessment: 81yo female.  Estimated Creatinine Clearance: 15 mL/min (by C-G formula based on Cr of 2.02).  Goal of Therapy:  Eradicate infection.  Plan:  Cefepime 1gm IV q24hrs Monitor labs, cultures, renal fxn, and progress  Valrie HartHall, Krystyne Tewksbury A 10/14/2014,8:43 AM

## 2014-11-03 NOTE — Care Management Utilization Note (Signed)
UR completed 

## 2014-11-03 NOTE — Progress Notes (Signed)
Patient without heartbeat or respirations, patient examined by Deno EtienneMary Dickerson RN and Dagoberto LigasJessica Mays RN, time of death [phone].  MD informed and [facility] called. [facility] [phone].

## 2014-11-03 NOTE — Progress Notes (Signed)
Patient's daughter called staff to room to check on patient due to change in respirations. Patient BP 87/43, 02 sat on 6 liters running in the high 80s , respirations slowed to 16. Patient unresponsive at this time. Patient's daughter at bedside calling in the rest of the family. MD called and informed of change in patient's condition.

## 2014-11-03 NOTE — H&P (Addendum)
[facility] History and Physical  Dominique Miller:[id number] DOB: July 13, 1919 DOA: 11/01/14  Referring physician: ED physician, Dr. Bebe Shaggy PCP: PROVIDER NOT IN SYSTEM   Chief Complaint: Altered mental status  HPI: Dominique Miller is a 81 y.o. female with a history of Alzheimer's dementia, hypothyroidism, diabetes mellitus without complications, and COPD. She also has a history of a recent hospitalization approximately 4 weeks ago for management of a fall that resulted in a scalp hematoma and subdural hematoma. She presents today from [facility] skilled nursing facility with a report of altered mental status. The patient is unable to provide any history due to her dementia. The history is being provided by the patient's daughter, Dominique Miller and the ED notes. Accordingly, over the past couple days, the patient's intake has been decreased. Per [name], she had several episodes of loose stools and nausea and vomiting yesterday. Per the notes gathered by the ED from the nursing facility, the patient was lethargic and cool to touch and reportedly did not have a pulse. She was difficult to arouse. However when EMS arrived, the patient was noted to have a pulse. The patient, when asked, nods yes to abdominal pain during the abdominal exam.  In the ED, the patient is noted to be febrile with temperature 101.6, tachycardic, and with a blood pressure that ranged from 76/30 9-2 101/54. Her white blood cell count is elevated at 16.7, CO2 of 17, creatinine of 2.02, and glucose of 159. Her chest x-ray reveals pulmonary fibrosis without definite superimposed disease. Her urinalysis reveals positive nitrite, 3-6 WBCs, many bacteria, large bilirubin, 7-10 RBCs. She is being admitted for further evaluation and management.     Review of Systems:  As above in history present illness. In addition, the patient's review of systems is positive for chronic memory loss, confusion from dementia,  inability to ambulate due to chronically weak legs. Otherwise review systems is negative.  Past Medical History  Diagnosis Date  . Dementia   . Hypothyroidism   . Anemia   . Diabetes mellitus without complication   . COPD (chronic obstructive pulmonary disease)   . Abnormal thyroid blood test 2014-11-01  . SDH (subdural hematoma) 09/12/2014  . Scalp hematoma 03/22/2014   Past Surgical History  Procedure Laterality Date  . No past surgeries     Social History: The patient is a resident of [facility] skilled nursing facility. She had 12 children, all 11 are living. Rosetta Dominique Miller is one of her daughters was present today. She reports that the patient is a DO NOT RESUSCITATE. Patient has a history of heavy tobacco use, none now. No history of illicit drug use or alcohol use.   No Known Allergies  Family History  Problem Relation Age of Onset  . Heart attack Mother   . Heart attack Father     Prior to Admission medications   Medication Sig Start Date End Date Taking? Authorizing Provider  ALPRAZolam Prudy Feeler) 0.5 MG tablet Take 1 tablet (0.5 mg total) by mouth 2 (two) times daily as needed for anxiety. 09/13/14   Erick Blinks, MD  aluminum & magnesium hydroxide-simethicone (MYLANTA) 500-450-40 MG/5ML suspension Take 30 mLs by mouth every 6 (six) hours as needed for indigestion (do not exceed 4 doses in 24 hours).    Historical Provider, MD  calcium-vitamin D (OSCAL WITH D) 500-200 MG-UNIT per tablet Take 1 tablet by mouth 2 (two) times daily.    Historical Provider, MD  Cholecalciferol (VITAMIN D3) 50000 UNITS CAPS Take 1  capsule by mouth every 30 (thirty) days.     Historical Provider, MD  divalproex (DEPAKOTE SPRINKLE) 125 MG capsule Take 250 mg by mouth 2 (two) times daily.    Historical Provider, MD  DULoxetine (CYMBALTA) 30 MG capsule Take 30 mg by mouth every other day.     Historical Provider, MD  folic acid (FOLVITE) 800 MCG tablet Take 800 mcg by mouth daily.    Historical Provider,  MD  HYDROcodone-acetaminophen (NORCO/VICODIN) 5-325 MG per tablet Take 1 tablet by mouth every 8 (eight) hours as needed (pain). For 1 month then D/C(started 09/04/14) 09/13/14   Erick Blinks, MD  levothyroxine (SYNTHROID, LEVOTHROID) 125 MCG tablet Take 125 mcg by mouth daily.    Historical Provider, MD  lisinopril (PRINIVIL,ZESTRIL) 2.5 MG tablet Take 2.5 mg by mouth daily.    Historical Provider, MD  loratadine (CLARITIN) 10 MG tablet Take 10 mg by mouth daily.    Historical Provider, MD  Melatonin 3 MG TABS Take 1 tablet by mouth at bedtime.    Historical Provider, MD  memantine (NAMENDA XR) 28 MG CP24 24 hr capsule Take 28 mg by mouth daily.    Historical Provider, MD  metoprolol tartrate (LOPRESSOR) 25 MG tablet Take 12.5 mg by mouth daily.    Historical Provider, MD  omeprazole (PRILOSEC) 20 MG capsule Take 20 mg by mouth daily.    Historical Provider, MD  promethazine (PHENERGAN) 25 MG tablet Take 25 mg by mouth every 6 (six) hours as needed for nausea or vomiting (Not to exceed 4 doses in 24 hours).     Historical Provider, MD  vitamin B-12 (CYANOCOBALAMIN) 1000 MCG tablet Take 1,000 mcg by mouth daily.    Historical Provider, MD   Physical Exam: Filed Vitals:   October 20, 2014 0715 20-Oct-2014 0739 2014/10/20 0816 2014-10-20 0822  BP: 101/54  76/39 100/80  Pulse:   100   Temp:   97.6 F (36.4 C)   TempSrc:   Axillary   Resp:  20    Height:    (1.626 m)   Weight:   61.236 kg (135 lb)   SpO2:  98% 99%     Wt Readings from Last 3 Encounters:  10/20/2014 61.236 kg (135 lb)  09/13/14 70.1 kg (154 lb 8.7 oz)  04/15/14 78.472 kg (173 lb)    General: Elderly debilitated after-American woman laying in bed, in no acute distress. Eyes: Exam is difficult, but her pupils appear to be equal, round, and reactive to light. Extraocular movements intact. Conjunctivae are clear and sclerae are white. ENT: Patient is grossly hard of hearing. She is a mouth breather. She has very dry oral pharynx mucous  membranes; no teeth. Neck: no LAD, masses or thyromegaly Cardiovascular: Irregular, irregular, versus S1-S2 with frequent ectopy. Telemetry: Ectopy versus irregular, irregular  Respiratory: Mouth breather; lungs are clear anteriorly with occasional fine crackles heard in the bases. Abdomen: Positive bowel sounds; protuberant; mildly and diffusely tender, query stool burden; query mildly distended. Skin: Approximate 3-1/2 cm round nodule on the left side of her for head; ecchymosis on her left maxillary area; feet are cool. Musculoskeletal: grossly arthritic hypertrophic bone changes seen in her knees and her hands; no acute hot red joints. Psychiatric: She is alert and answers to her name, but is unable to provide any significant history due to her dementia. Her speech is unintelligible. Neurologic: She is alert and oriented to herself by name and possibly to her daughter.  Labs on Admission:  Basic Metabolic Panel:  Recent Labs Lab 31-Mar-2015 0439  NA 139  K 5.0  CL 106  CO2 17*  GLUCOSE 159*  BUN 45*  CREATININE 2.02*  CALCIUM 9.0   Liver Function Tests: No results for input(s): AST, ALT, ALKPHOS, BILITOT, PROT, ALBUMIN in the last 168 hours. No results for input(s): LIPASE, AMYLASE in the last 168 hours. No results for input(s): AMMONIA in the last 168 hours. CBC:  Recent Labs Lab 31-Mar-2015 0439  WBC 16.7*  NEUTROABS 14.2*  HGB 14.7  HCT 46.2*  MCV 94.7  PLT 317   Cardiac Enzymes: No results for input(s): CKTOTAL, CKMB, CKMBINDEX, TROPONINI in the last 168 hours.  BNP (last 3 results) No results for input(s): BNP in the last 8760 hours.  ProBNP (last 3 results) No results for input(s): PROBNP in the last 8760 hours.  CBG: No results for input(s): GLUCAP in the last 168 hours.  Radiological Exams on Admission: Dg Chest Portable 1 View  10/20/2014   CLINICAL DATA:  Weakness and shortness of breath. Altered mental status  EXAM: PORTABLE CHEST - 1 VIEW   COMPARISON:  09/12/2014  FINDINGS: More prominent diffuse interstitial opacity which is likely from hypoaeration. No pneumonia or edema is definite. The patient has pulmonary fibrosis based on previous CT imaging 04/15/2014. There is cardiomegaly, with distortion by rightward rotation. No effusion or pneumothorax.  IMPRESSION: Limited study due to hypoaeration. There is pulmonary fibrosis without definite superimposed disease.   Electronically Signed   By: Marnee SpringJonathon  Watts M.D.   On: Jul 03, 2015 05:22    EKG: Independently reviewed. Atrial fibrillation with heart rate of 127 bpm  Assessment/Plan Principal Problem:   Acute encephalopathy Active Problems:   UTI (lower urinary tract infection)   Acute kidney injury   SDH (subdural hematoma)   Diabetes mellitus type 2, controlled, without complications   Metabolic acidosis   Nausea with vomiting   Abdominal tenderness   Alzheimer's disease   Scalp hematoma   Abnormal thyroid blood test   1. Acute encephalopathy superimposed on chronic dementia and recent subdural hematoma. It is likely that the patient's acute encephalopathy is secondary to her UTI. She appears to be improving some per her daughter following IV fluids and IV antibiotics in the ED. Will consider scanning her head if she does not continue to improve with IV antibiotics and supportive treatment. 2. Sepsis secondary to UTI. The patient was tachycardic, relatively hypotensive, and with a urinary tract infection in the ED. We'll continue treating the UTI and volume repletion. Blood cultures and urine cultures were ordered. Cefepime was started in the ED and will continue. 3. Urinary tract infection. Cefepime started and continued. Urine culture is pending. 4. Atrial fibrillation with RVR. Apparently this is a new diagnosis. Will treat conservatively. Beta blocker is on hold, but will order when necessary IV metoprolol, limited because of her soft blood pressure. Will order a 2-D  echocardiogram for further evaluation. TSH noted to be mildly elevated during previous hospitalization. We'll hold off on antiplatelet and anticoagulation therapy due to recent subdural hematoma. 5. Acute kidney injury. She has no history of chronic kidney disease and her creatinine was within normal limits during a previous hospitalization in February 2016. This is likely the result of the UTI and volume depletion from recent nausea and vomiting. 6. Metabolic acidosis. This is likely secondary to sepsis and/or acute renal failure. We'll add gentle bicarbonate to the IV fluids and continue to monitor her CO2. 7.  Recent history of nausea and vomiting/abdominal tenderness on exam. The etiology could be secondary to the UTI, but will order an abdominal x-ray, lipase, and liver transaminases for further evaluation. We'll give her Protonix IV with when necessary Zofran. Will start clear liquids as she can tolerate it, but will hold most of her oral medications until tomorrow. 8. Type 2 diabetes mellitus without complications. Will treat with sliding scale NovoLog. 9. Abnormal TSH/subclinical hypothyroidism, February 2016. A follow-up TSH and free T4 recommended in 4-6 weeks. Will restart Synthroid tomorrow. 10. Alzheimer's dementia with a history of behavioral disturbances. She is treated with Depakote chronically. Will order a valproic acid level. 11. Recent fall at skilled nursing facility February 2016 leading to subdural hematoma and scalp hematoma. We'll hold off on rescanning her head for now, but low threshold to get another CT scan of her head if she does not improve with treatment of her UTI.    Code Status: DO NOT RESUSCITATE DVT Prophylaxis: SCDs Family Communication: Discussed with her daughter Dominique Miller Disposition Plan: Discharge back to skilled nursing facility when clinically appropriate  Time spent: One hour  [facility] Pager [phone]

## 2014-11-03 NOTE — ED Notes (Signed)
Pt to department via EMS.  Per report, call was for cardiac arrest, but pt was breathing upon arrival.  Pt pale and cool to touch upon arrival to ED.  Pt does have history of dementia.  Responds to painful stimuli.,

## 2014-11-03 NOTE — ED Provider Notes (Signed)
CSN: [id number]     Arrival date & time 10/07/2014  0351 History   First MD Initiated Contact with Patient Dec 11, 2014 0410     Chief Complaint  Patient presents with  . Altered Mental Status   LEVEL 5 CAVEAT DUE TO ALTERED MENTAL STATUS  Patient is a 81 y.o. female presenting with altered mental status. The history is provided by the EMS personnel and a relative. The history is limited by the condition of the patient.  Altered Mental Status Presenting symptoms: confusion   Severity:  Severe Duration: UNKNOWN TIME AGO. Timing:  Constant Progression:  Worsening Chronicity:  New Associated symptoms: fever   Patient presents from nursing facility for altered mental status Per EMS, pt was lethargic and cool to touch Per nursing home report, pt was lethargic but did have pulses and no CPR was administered Pt was sent for further evaluation Pt is unable to contribute any history   Past Medical History  Diagnosis Date  . Dementia   . Hypothyroidism   . Anemia   . Diabetes mellitus without complication   . COPD (chronic obstructive pulmonary disease)    Past Surgical History  Procedure Laterality Date  . No past surgeries     Family History  Problem Relation Age of Onset  . Heart attack Mother   . Heart attack Father    History  Substance Use Topics  . Smoking status: Never Smoker   . Smokeless tobacco: Never Used  . Alcohol Use: No   OB History    No data available     Review of Systems  Unable to perform ROS: Mental status change  Constitutional: Positive for fever.  Psychiatric/Behavioral: Positive for confusion.      Allergies  Review of patient's allergies indicates no known allergies.  Home Medications   Prior to Admission medications   Medication Sig Start Date End Date Taking? Authorizing Provider  ALPRAZolam Prudy Feeler(XANAX) 0.5 MG tablet Take 1 tablet (0.5 mg total) by mouth 2 (two) times daily as needed for anxiety. 09/13/14   Erick BlinksJehanzeb Memon, MD  aluminum &  magnesium hydroxide-simethicone (MYLANTA) 500-450-40 MG/5ML suspension Take 30 mLs by mouth every 6 (six) hours as needed for indigestion (do not exceed 4 doses in 24 hours).    Historical Provider, MD  calcium-vitamin D (OSCAL WITH D) 500-200 MG-UNIT per tablet Take 1 tablet by mouth 2 (two) times daily.    Historical Provider, MD  Cholecalciferol (VITAMIN D3) 50000 UNITS CAPS Take 1 capsule by mouth every 30 (thirty) days.     Historical Provider, MD  divalproex (DEPAKOTE SPRINKLE) 125 MG capsule Take 250 mg by mouth 2 (two) times daily.    Historical Provider, MD  DULoxetine (CYMBALTA) 30 MG capsule Take 30 mg by mouth every other day.     Historical Provider, MD  folic acid (FOLVITE) 800 MCG tablet Take 800 mcg by mouth daily.    Historical Provider, MD  HYDROcodone-acetaminophen (NORCO/VICODIN) 5-325 MG per tablet Take 1 tablet by mouth every 8 (eight) hours as needed (pain). For 1 month then D/C(started 09/04/14) 09/13/14   Erick BlinksJehanzeb Memon, MD  levothyroxine (SYNTHROID, LEVOTHROID) 125 MCG tablet Take 125 mcg by mouth daily.    Historical Provider, MD  lisinopril (PRINIVIL,ZESTRIL) 2.5 MG tablet Take 2.5 mg by mouth daily.    Historical Provider, MD  loratadine (CLARITIN) 10 MG tablet Take 10 mg by mouth daily.    Historical Provider, MD  Melatonin 3 MG TABS Take 1 tablet by mouth at bedtime.  Historical Provider, MD  memantine (NAMENDA XR) 28 MG CP24 24 hr capsule Take 28 mg by mouth daily.    Historical Provider, MD  metoprolol tartrate (LOPRESSOR) 25 MG tablet Take 12.5 mg by mouth daily.    Historical Provider, MD  omeprazole (PRILOSEC) 20 MG capsule Take 20 mg by mouth daily.    Historical Provider, MD  promethazine (PHENERGAN) 25 MG tablet Take 25 mg by mouth every 6 (six) hours as needed for nausea or vomiting (Not to exceed 4 doses in 24 hours).     Historical Provider, MD  vitamin B-12 (CYANOCOBALAMIN) 1000 MCG tablet Take 1,000 mcg by mouth daily.    Historical Provider, MD   BP 99/54  mmHg  Pulse 140  Temp(Src) 101.6 F (38.7 C) (Rectal)  Resp 28  Wt 154 lb (69.854 kg)  SpO2 98% Physical Exam CONSTITUTIONAL: elderly, frail HEAD: Normocephalic/atraumatic EYES: EOMI/PERRL ENMT: Mucous membranes dry, bilateral periorbital bruising noted (chronic per family) NECK: supple no meningeal signs CV: tachycardic LUNGS: coarse BS noted bilaterally, tachypneic ABDOMEN: soft, nontender NEURO: Pt is resting comfortably.  She is arousable to voice and will speak intermitently.  She moves all extremities EXTREMITIES: no evidence of trauma to extremities SKIN: feet are cool to touch PSYCH: unable to assess due to her mental status  ED Course  Procedures CRITICAL CARE Performed by: Joya Gaskins Total critical care time: 35 Critical care time was exclusive of separately billable procedures and treating other patients. Critical care was necessary to treat or prevent imminent or life-threatening deterioration. Critical care was time spent personally by me on the following activities: development of treatment plan with patient and/or surrogate as well as nursing, discussions with consultants, evaluation of patient's response to treatment, examination of patient, obtaining history from patient or surrogate, ordering and performing treatments and interventions, ordering and review of laboratory studies, ordering and review of radiographic studies, pulse oximetry and re-evaluation of patient's condition. PATIENT TACHYCARDIC, FEBRILE, LIKELY UROSEPSIS, HEART RATE>140 AND REQUIRED IV FLUIDS AND IV ANTIBIOTICS.  SHE IS ALSO IN ATRIAL FIBRILLATION WITH RVR WHICH APPEARS NEW.  SHE ALSO HAD WORSENING CONFUSION  4:57 AM Pt presents from facility for altered mental status Pt is febrile She is now more arousable 2 daughters are at bedside - they report pt is a DNR/DNI They request evaluation of her fever and can treat with fluids/antibiotics but no other interventions Code Sepsis was NOT  called due to DNR status She has h/o recent Subdural that was managed non-operatively She lives currently at [facility] 5:53 AM HEART RATE IMPROVING PT MORE ALERT DEHYDRATION NOTED LABS PENDING AT THIS TIME UPDATED FAMILY ON PLAN AWAITING URINE TESTING 7:18 AM uti noted SBP remaining in the 90s She is more alert D/w dr Sherrie Mustache will admit to tele Pt is a DNR Labs Review Labs Reviewed  CBC WITH DIFFERENTIAL/PLATELET - Abnormal; Notable for the following:    WBC 16.7 (*)    HCT 46.2 (*)    RDW 15.7 (*)    Neutrophils Relative % 86 (*)    Neutro Abs 14.2 (*)    Lymphocytes Relative 10 (*)    All other components within normal limits  BASIC METABOLIC PANEL - Abnormal; Notable for the following:    CO2 17 (*)    Glucose, Bld 159 (*)    BUN 45 (*)    Creatinine, Ser 2.02 (*)    GFR calc non Af Amer 20 (*)    GFR calc Af Amer 23 (*)  Anion gap 16 (*)    All other components within normal limits  URINALYSIS, ROUTINE W REFLEX MICROSCOPIC - Abnormal; Notable for the following:    Color, Urine BROWN (*)    APPearance CLOUDY (*)    Glucose, UA 100 (*)    Hgb urine dipstick SMALL (*)    Bilirubin Urine LARGE (*)    Ketones, ur 15 (*)    Protein, ur >300 (*)    Urobilinogen, UA 4.0 (*)    Nitrite POSITIVE (*)    Leukocytes, UA TRACE (*)    All other components within normal limits  URINE MICROSCOPIC-ADD ON - Abnormal; Notable for the following:    Bacteria, UA MANY (*)    All other components within normal limits  CULTURE, BLOOD (ROUTINE X 2)  CULTURE, BLOOD (ROUTINE X 2)  URINE CULTURE    Imaging Review Dg Chest Portable 1 View  25-Oct-2014   CLINICAL DATA:  Weakness and shortness of breath. Altered mental status  EXAM: PORTABLE CHEST - 1 VIEW  COMPARISON:  09/12/2014  FINDINGS: More prominent diffuse interstitial opacity which is likely from hypoaeration. No pneumonia or edema is definite. The patient has pulmonary fibrosis based on previous CT imaging 04/15/2014. There is  cardiomegaly, with distortion by rightward rotation. No effusion or pneumothorax.  IMPRESSION: Limited study due to hypoaeration. There is pulmonary fibrosis without definite superimposed disease.   Electronically Signed   By: Marnee Spring M.D.   On: 2014-10-25 05:22     EKG Interpretation   Date/Time:  Wednesday 2014-10-25 05:34:06 EST Ventricular Rate:  127 PR Interval:    QRS Duration: 75 QT Interval:  368 QTC Calculation: 535 R Axis:   34 Text Interpretation:  Atrial flutter/fibrillation Ventricular premature  complex Probable posterior infarct, recent Lateral leads are also involved  Prolonged QT interval ATRIAL FIBRILLATION APPEARS NEW WHEN COMPARED TO  PRIOR Confirmed by Bebe Shaggy  MD, Jenella Craigie ([id number]) on 10-25-2014 5:53:21 AM     Medications  sodium chloride 0.9 % bolus 1,000 mL (not administered)  sodium chloride 0.9 % bolus 1,000 mL (0 mLs Intravenous Stopped 25-Oct-2014 0606)  acetaminophen (TYLENOL) suppository 650 mg (650 mg Rectal Given 25-Oct-2014 0459)  ceFEPIme (MAXIPIME) 2 g in dextrose 5 % 50 mL IVPB (2 g Intravenous New Bag/Given 2014-10-25 6045)    MDM   Final diagnoses:  AKI (acute kidney injury)  Dehydration  Somnolence  Acute febrile illness  UTI (lower urinary tract infection)  Atrial fibrillation with RVR  Metabolic acidosis    Nursing notes including past medical history and social history reviewed and considered in documentation Labs/vital reviewed myself and considered during evaluation Previous records reviewed and considered xrays/imaging reviewed by myself and considered during evaluation     Joya Gaskins, MD 10/25/2014 [phone]

## 2014-11-03 NOTE — Care Management Note (Signed)
    Page 1 of 1   10/17/2014     3:25:57 PM CARE MANAGEMENT NOTE 10/03/2014  Patient:  Jasper RilingGRAVES,Shernell M   Account Number:  [id number]  Date Initiated:  Feb 20, 2015  Documentation initiated by:  Kathyrn SheriffHILDRESS,JESSICA  Subjective/Objective Assessment:   Pt admitted with sepsis. Pt is from [facility]. Plan for return to SNF at discharge. CSW is aware and will arrange for return to facility. No CM needs.     Action/Plan:   Anticipated DC Date:  10/07/2014   Anticipated DC Plan:  SKILLED NURSING FACILITY  In-house referral  Clinical Social Worker      DC Planning Services  CM consult      Choice offered to / List presented to:             Status of service:  Completed, signed off Medicare Important Message given?   (If response is "NO", the following Medicare IM given date fields will be blank) Date Medicare IM given:   Medicare IM given by:   Date Additional Medicare IM given:   Additional Medicare IM given by:    Discharge Disposition:  SKILLED NURSING FACILITY  Per UR Regulation:  Reviewed for med. necessity/level of care/duration of stay  If discussed at Long Length of Stay Meetings, dates discussed:    Comments:  10/23/2014 1530 Kathyrn SheriffJessica Childress, RN, MSN, CM

## 2014-11-03 NOTE — Clinical Social Work Psychosocial (Signed)
Clinical Social Work Department BRIEF PSYCHOSOCIAL ASSESSMENT 10/08/2014  Patient:  Dominique Miller, Dominique Miller     Account Number:  [id number]     Admit date:  2014-10-08  Clinical Social Worker:  Daiva Huge  Date/Time:  10/08/14 10:50 AM  Referred by:  Physician  Date Referred:  08-Oct-2014 Referred for  SNF Placement   Other Referral:   Interview type:  Family Other interview type:   Spoke with 2 daughters at bedside- Douglas  Baker Janus [phone]    PSYCHOSOCIAL DATA Living Status:  FACILITY Admitted from facility:  [facility] Level of care:  [place] Primary support name:  Rosetta and Baker Janus Primary support relationship to patient:  FAMILY Degree of support available:   good    CURRENT CONCERNS Current Concerns  Post-Acute Placement   Other Concerns:    SOCIAL WORK ASSESSMENT / PLAN CSW met with patient and family at bedside- patient is asleep and per family disoriented. Patient was admitted from [facility] where she has been for since February. Per family, she was at [facility] for a couple of years- family is pleased that they were able to get her moved to [facility] as they live locally in [place] and can visit more easily. family has been concerned about her frequent falls over the past year and state [facility] is working to arrange safety precautions (matt on floor).  Family voices an understanding of her medical condition and care being provided-   Assessment/plan status:  Other - See comment Other assessment/ plan:   updated FL2 and SNF   Information/referral to community resources:    PATIENT'S/FAMILY'S RESPONSE TO PLAN OF CARE: Family shared with CSW that the patient had 12 children- 79 of which are living. Family shared with CSW memories of growing up in [place] and [place]) with such large family-  "we have lots of good memories".  Family is very involved and supportive- they are anticiipating dc back to SNF at  dc.       Eduard Clos, MSW, [place]

## 2014-11-03 NOTE — Discharge Summary (Signed)
Death Summary  Dominique Miller ZOX:[id number] DOB: [birth year] DOA: 10/26/2014  PCP: PROVIDER NOT IN SYSTEM   Admit date: 10-26-2014 Date of Death: 2014/10/26  Final Diagnoses:   1. Cardiopulmonary arrest, etiology likely multifactorial including sepsis, urinary tract infection, volvulus, recent       subdural hematoma, and advanced dementia. 2. Acute encephalopathy secondary to sepsis/infection, superimposed on chronic dementia and recent       subdural hematoma. 3. Sepsis secondary to UTI. 4. Sigmoid volvulus, noted on the abdominal x-ray prior to patient's death. 5. Chronic pulmonary fibrosis. 6. Recent subdural hematoma, 09/12/14 following a fall at the skilled nursing facility. Treated conservatively. 7. Atrial fibrillation with rapid ventricular response, apparently newly diagnosed. 8. Acute kidney injury, likely secondary to sepsis and hypovolemia/hypoperfusion. 9. Metabolic acidosis, secondary to sepsis and/or acute renal failure. 10. Type 2 diabetes mellitus, apparently well controlled. 11. Alzheimer's dementia with a history of behavioral disturbances.   History of present illness:   Dominique Miller was a 81 y.o. female with a history of Alzheimer's dementia, hypothyroidism, diabetes mellitus without complications, and COPD. She also had a history of a recent hospitalization approximately 4 weeks ago for management of a fall that resulted in a scalp hematoma and subdural hematoma-treated conservatively. She presented to the ED from [facility] skilled nursing facility with a report of altered mental status. The patient was unable to provide any history due to her dementia. The history was provided by the patient's daughter, Dominique Miller and the ED notes. Accordingly, over the past couple days, the patient's intake had been decreased. Per Ms. Clinton Miller, she had several episodes of loose stools and nausea and vomiting the day before. Per the notes gathered by the ED from the nursing facility,  the patient was difficult to arouse and was found to be lethargic and cool to touch and reportedly did not have a pulse. CPR was not attempted at the SNF because the patient was a DNR. However when EMS arrived, the patient was noted to have a pulse. In the ED, the patient was noted to be febrile with temperature 101.6, tachycardic, and with a blood pressure that ranged from 76/30 9-2 101/54. Her white blood cell count was elevated at 16.7, CO2 of 17, creatinine of 2.02, and glucose of 159. Her chest x-ray revealed pulmonary fibrosis without definite superimposed disease. Her urinalysis revealed  nitrite, 3-6 WBCs, many bacteria, large bilirubin, 7-10 RBCs. She was being for further evaluation and management.   Hospital Course:  On the initial exam, the patient was noted to be elderly, very debilitated, and demented. However, she was able to answer to her name and appeared to have recognized her daughter in the room. I discussed CODE STATUS with her daughter, Dominique Miller and she stated that the patient was a DO NOT RESUSCITATE. She also noted that the patient had been sick over the last couple days and she actually thought that the patient would pass away the night before or early this morning at the skilled nursing facility. She requested treatment for her infections, but she did not want aggressive diagnostic workup or invasive treatment. CT scan of her head was not ordered because her acute encephalopathy was thought to be secondary to the UTI and clinical sepsis. On exam, the patient was noted to have some abdominal tenderness, therefore lipase, liver transaminases, an abdominal x-ray were ordered following her admission at approximately 10 AM. IV Protonix was ordered. Also ordered were blood cultures and a urine culture. In the meantime,  the patient had been started on IV fluids for hypotension and acute renal failure, and cefepime for the UTI. Gentle bicarbonate was added to the IV fluids for  treatment of metabolic acidosis. 2-D echocardiogram was ordered to evaluate for apparently a new diagnosed atrial fibrillation with mild RVR. As needed metoprolol IV was ordered for a rapid heart rate, but this was limited because of her soft blood pressures. Sliding scale NovoLog was started for treatment of her diabetes.  The laboratory studies ordered earlier were reviewed and her lipase was within normal limits, her AST was modestly elevated, but the remainder of her hepatic function panel was within normal limits. The abdominal x-ray apparently was not performed until after 5:00 PM. When she returned from radiology, the registered nurse informed me that the patient's daughter noted a change in her respirations. Per the nurse's assessment, the patient's blood pressure had fallen to the 80s systolically and that her oxygen saturations were in the upper 80s on 6 L of oxygen. She was noted to have a decrease in her respirations and became virtually unresponsive. Per the nursing staff, the family did not want an aggressive diagnostic workup and was at peace that there mother was actively dying. As I was about to see the patient for follow-up, the nurse page back and informed me that she had expired at 5:15 PM. Shortly thereafter, radiologist, Dr. Tyron RussellBoles called and informed me that the abdominal x-ray revealed a likelihood of a sigmoid volvulus and that CT of the abdomen was recommended to assess this. However, I informed Dr. Tyron RussellBoles that the patient had recently expired.  The etiology of the patient's death was not clearly elucidated, but it was likely multifactorial. She was elderly and quite debilitated. The presumed primary cause of death was sepsis which was likely from the UTI but with significant contributing factors including a recent subdural hematoma, apparent volvulus, and advanced dementia.      Signed:  Albena Miller  [facility] 10/29/2014, 7:14 PM

## 2014-11-03 NOTE — ED Notes (Addendum)
Per AM report, pt DNR and third bolus infusing at this time.

## 2014-11-03 DEATH — deceased

## 2016-07-02 IMAGING — CT CT CERVICAL SPINE W/O CM
5 of 8 series · 14 of 33 positions shown, 15 images · non-contrast
Comparison: None.

CLINICAL DATA: Recent neck and facial injury

EXAM:
CT MAXILLOFACIAL WITHOUT CONTRAST
CT CERVICAL SPINE WITHOUT CONTRAST
TECHNIQUE: Multidetector CT imaging of the head, cervical spine, and
maxillofacial structures were performed using the standard protocol
without intravenous contrast. Multiplanar CT image reconstructions
of the cervical spine and maxillofacial structures were also
generated.

[Series 4: cervical 2.0 st axial · axial · 0.39mm/px · z∈[+134,+180]mm · 2 of 71 slices shown, 3 images]
[im 24/71  soft-tissue]
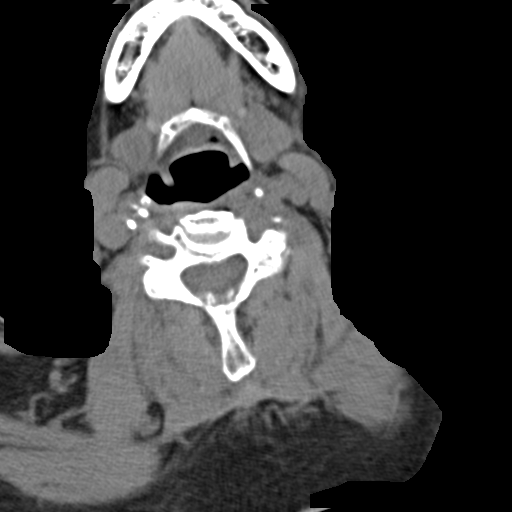
[im 24/71  bone]
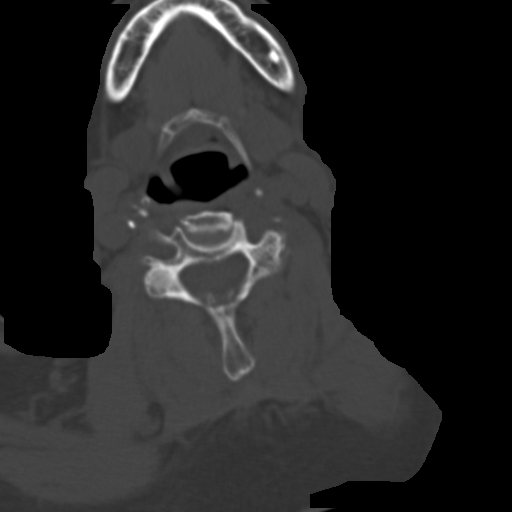
[im 47/71  bone]
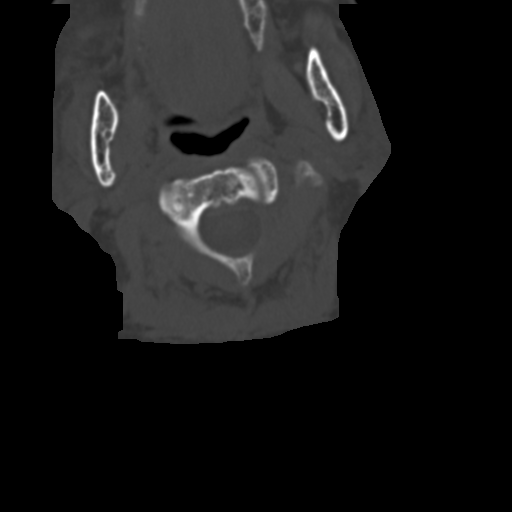

[Series 8: cervical spine axial bone · axial · 0.24mm/px · z∈[+97,+141]mm · 2 of 78 slices shown]
[im 26/78  bone]
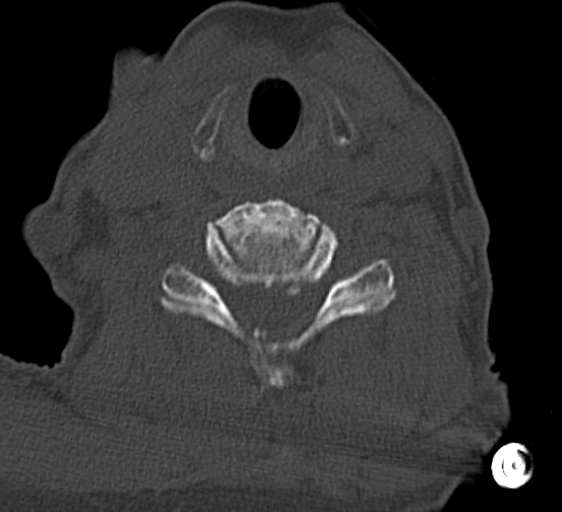
[im 52/78  bone]
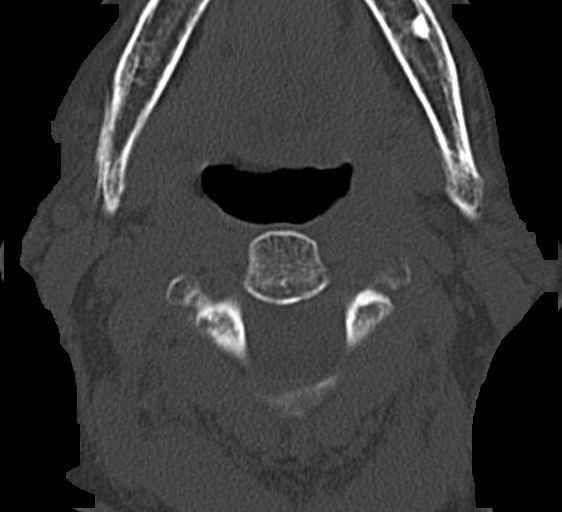

[Series 9: max soft 2.0 h31s · axial · 0.43mm/px · z∈[+145,+236]mm · 4 of 100 slices shown]
[im 20/100  soft-tissue]
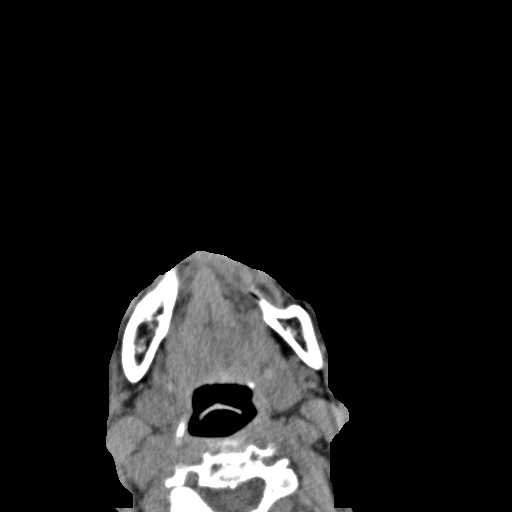
[im 40/100  soft-tissue]
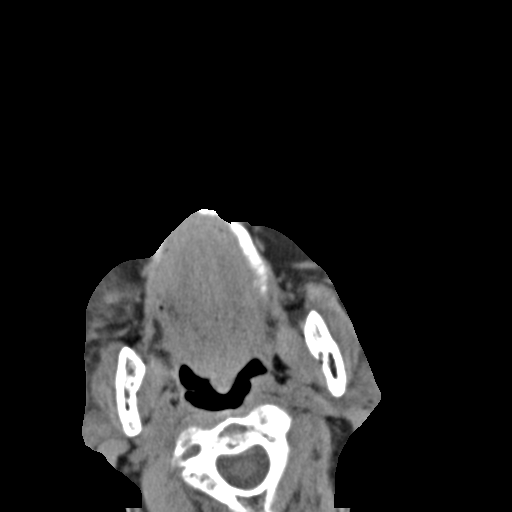
[im 60/100  soft-tissue]
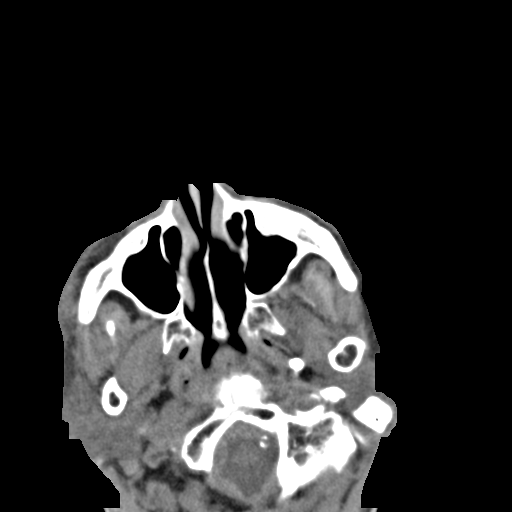
[im 80/100  soft-tissue]
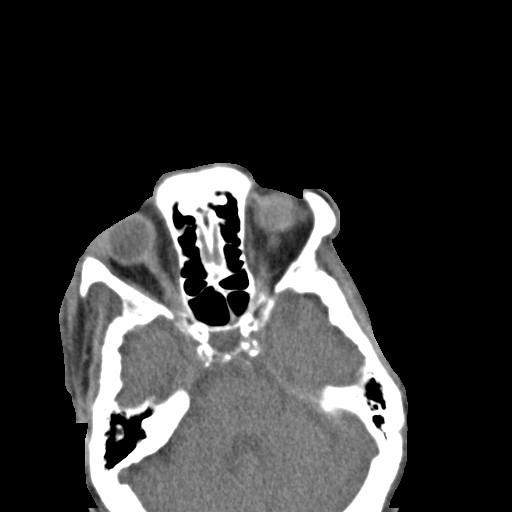

[Series 12: max st sagittal · sagittal · 0.30mm/px · 5 of 84 slices shown]
[im 12/84  bone]
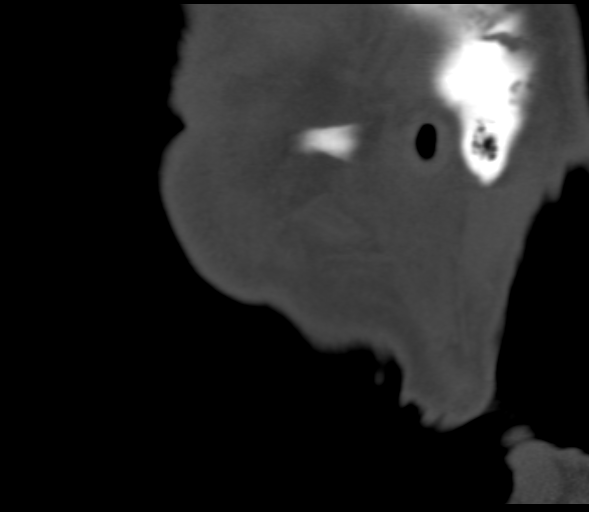
[im 24/84  bone]
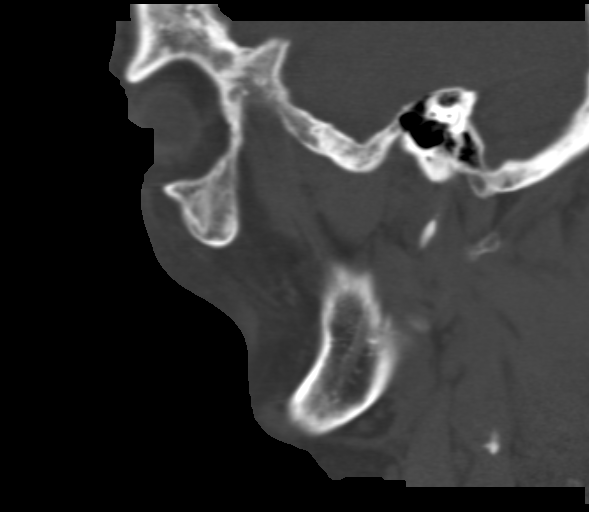
[im 36/84  bone]
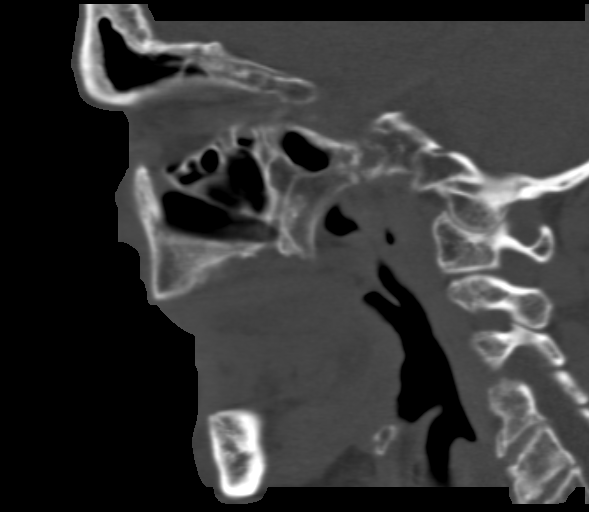
[im 48/84  bone]
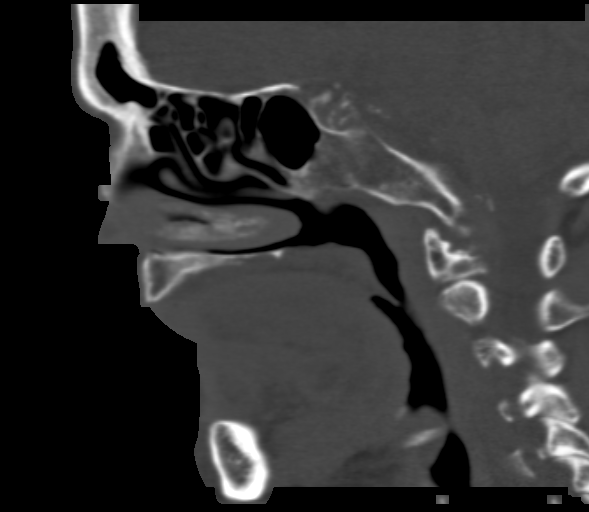
[im 60/84  bone]
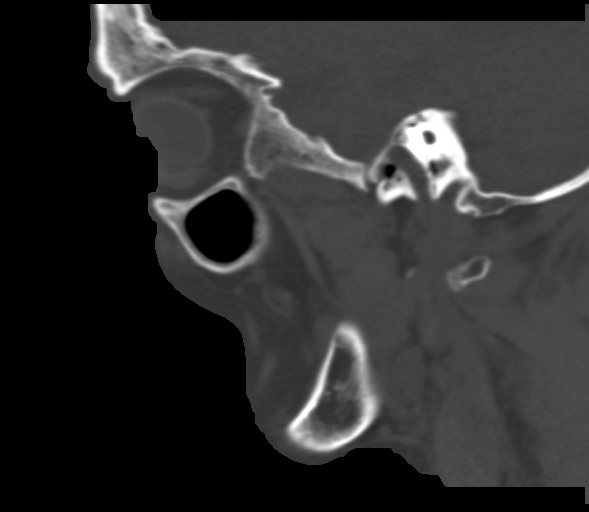

[Series 13: max bone coronal 2.0 spo · coronal · 0.35mm/px · 1 of 108 slices shown]
[im 54/108  bone]
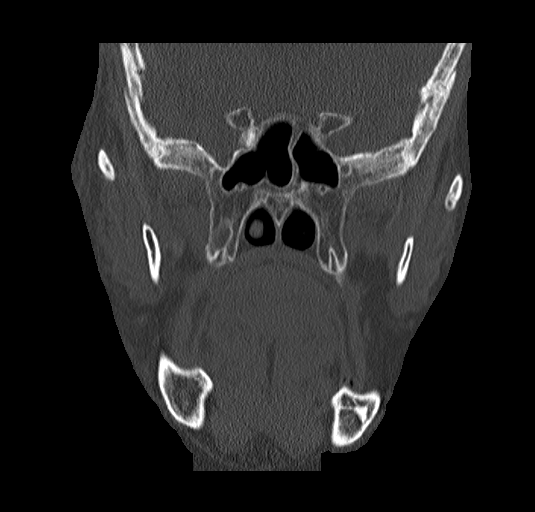

[14 of 33 positions shown; findings below may reference images not displayed]

FINDINGS: CT MAXILLOFACIAL FINDINGS

Soft tissue swelling is not along the calvarium on the right
consistent with a recent injury. No acute bony abnormality is seen.
The soft tissues are otherwise within normal limits. The paranasal
sinuses are unremarkable. The orbits and their contents are within
normal limits.

CT CERVICAL SPINE FINDINGS

Seven cervical segments are well visualized. Vertebral body height
is well maintained. No acute fracture or acute facet abnormality is
noted. Multilevel disc space narrowing with osteophytic change is
seen.
IMPRESSION: CT of the cervical spine: Multilevel degenerative change without
acute abnormality.

CT of the maxillofacial bones: Soft tissue swelling along the right
temporal region extending into the face consistent with a recent
injury. No acute bony abnormality is seen.
